# Patient Record
Sex: Female | Born: 1970 | Race: White | Hispanic: Yes | Marital: Married | Smoking: Never smoker
Health system: Southern US, Community
[De-identification: ages and names within clinical notes are randomized; demographics above are authoritative.]

## PROBLEM LIST (undated history)

## (undated) HISTORY — PX: APPENDECTOMY: SHX54

---

## 2016-04-22 DIAGNOSIS — N926 Irregular menstruation, unspecified: Secondary | ICD-10-CM | POA: Diagnosis not present

## 2016-04-22 DIAGNOSIS — Z01419 Encounter for gynecological examination (general) (routine) without abnormal findings: Secondary | ICD-10-CM | POA: Diagnosis not present

## 2016-05-21 DIAGNOSIS — Z1231 Encounter for screening mammogram for malignant neoplasm of breast: Secondary | ICD-10-CM | POA: Diagnosis not present

## 2016-05-23 DIAGNOSIS — L601 Onycholysis: Secondary | ICD-10-CM | POA: Diagnosis not present

## 2016-06-06 DIAGNOSIS — Z23 Encounter for immunization: Secondary | ICD-10-CM | POA: Diagnosis not present

## 2016-06-06 DIAGNOSIS — E785 Hyperlipidemia, unspecified: Secondary | ICD-10-CM | POA: Diagnosis not present

## 2016-06-06 DIAGNOSIS — N926 Irregular menstruation, unspecified: Secondary | ICD-10-CM | POA: Diagnosis not present

## 2016-06-06 DIAGNOSIS — E663 Overweight: Secondary | ICD-10-CM | POA: Diagnosis not present

## 2016-06-27 DIAGNOSIS — D485 Neoplasm of uncertain behavior of skin: Secondary | ICD-10-CM | POA: Diagnosis not present

## 2016-06-27 DIAGNOSIS — L601 Onycholysis: Secondary | ICD-10-CM | POA: Diagnosis not present

## 2017-04-28 DIAGNOSIS — K080 Exfoliation of teeth due to systemic causes: Secondary | ICD-10-CM | POA: Diagnosis not present

## 2017-08-04 DIAGNOSIS — K080 Exfoliation of teeth due to systemic causes: Secondary | ICD-10-CM | POA: Diagnosis not present

## 2017-09-08 DIAGNOSIS — Z0000 Encounter for general adult medical examination without abnormal findings: Secondary | ICD-10-CM | POA: Diagnosis not present

## 2017-09-08 DIAGNOSIS — Z131 Encounter for screening for diabetes mellitus: Secondary | ICD-10-CM | POA: Diagnosis not present

## 2017-09-08 DIAGNOSIS — Z13220 Encounter for screening for lipoid disorders: Secondary | ICD-10-CM | POA: Diagnosis not present

## 2017-09-08 DIAGNOSIS — Z1331 Encounter for screening for depression: Secondary | ICD-10-CM | POA: Diagnosis not present

## 2017-09-08 DIAGNOSIS — Z6823 Body mass index (BMI) 23.0-23.9, adult: Secondary | ICD-10-CM | POA: Diagnosis not present

## 2017-10-06 DIAGNOSIS — Z01419 Encounter for gynecological examination (general) (routine) without abnormal findings: Secondary | ICD-10-CM | POA: Diagnosis not present

## 2017-10-06 DIAGNOSIS — Z1231 Encounter for screening mammogram for malignant neoplasm of breast: Secondary | ICD-10-CM | POA: Diagnosis not present

## 2017-11-10 DIAGNOSIS — K080 Exfoliation of teeth due to systemic causes: Secondary | ICD-10-CM | POA: Diagnosis not present

## 2017-12-12 DIAGNOSIS — K080 Exfoliation of teeth due to systemic causes: Secondary | ICD-10-CM | POA: Diagnosis not present

## 2018-05-13 DIAGNOSIS — K080 Exfoliation of teeth due to systemic causes: Secondary | ICD-10-CM | POA: Diagnosis not present

## 2018-05-13 DIAGNOSIS — Z23 Encounter for immunization: Secondary | ICD-10-CM | POA: Diagnosis not present

## 2018-09-17 ENCOUNTER — Other Ambulatory Visit: Payer: Self-pay

## 2018-09-17 ENCOUNTER — Ambulatory Visit: Payer: Federal, State, Local not specified - PPO | Admitting: Sports Medicine

## 2018-09-17 ENCOUNTER — Encounter: Payer: Self-pay | Admitting: Sports Medicine

## 2018-09-17 ENCOUNTER — Other Ambulatory Visit: Payer: Self-pay | Admitting: Sports Medicine

## 2018-09-17 ENCOUNTER — Ambulatory Visit (INDEPENDENT_AMBULATORY_CARE_PROVIDER_SITE_OTHER): Payer: Federal, State, Local not specified - PPO

## 2018-09-17 VITALS — Temp 97.0°F | Resp 16

## 2018-09-17 DIAGNOSIS — M722 Plantar fascial fibromatosis: Secondary | ICD-10-CM | POA: Diagnosis not present

## 2018-09-17 DIAGNOSIS — M79671 Pain in right foot: Secondary | ICD-10-CM | POA: Diagnosis not present

## 2018-09-17 MED ORDER — PREDNISONE 10 MG (21) PO TBPK: ORAL_TABLET | ORAL | 0 refills | Status: DC

## 2018-09-17 MED ORDER — MELOXICAM 15 MG PO TABS: 15.0000 mg | ORAL_TABLET | Freq: Every day | ORAL | 0 refills | Status: DC

## 2018-09-17 MED ORDER — TRIAMCINOLONE ACETONIDE 40 MG/ML IJ SUSP
20.0000 mg | Freq: Once | INTRAMUSCULAR | Status: AC
  Administered 2018-09-17: 20 mg

## 2018-09-17 NOTE — Patient Instructions (Signed)

## 2018-09-17 NOTE — Progress Notes (Addendum)
Subjective: Paula Bautista is a 48 y.o. female patient presents to office with complaint of moderate heel pain on the right.  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  Patient is assisted by husband who is also translating this visit.  Denies any other pedal complaints.   Review of Systems  All other systems reviewed and are negative.    There are no active problems to display for this patient.   No current outpatient medications on file prior to visit.   No current facility-administered medications on file prior to visit.     Not on File  Objective: Physical Exam General: The patient is alert and oriented x3 in no acute distress.  Dermatology: Skin is warm, dry and supple bilateral lower extremities. Nails 1-10 are normal. There is no erythema, edema, no eccymosis, no open lesions present. Integument is otherwise unremarkable.  Vascular: Dorsalis Pedis pulse and Posterior Tibial pulse are 2/4 bilateral. Capillary fill time is immediate to all digits.  Neurological: Grossly intact to light touch with an achilles reflex of +2/5 and a  negative Tinel's sign bilateral.  Musculoskeletal: + Tenderness to palpation at the medial calcaneal tubercale and through the insertion of the plantar fascia on the right foot. No pain with compression of calcaneus bilateral. No pain with tuning fork to calcaneus bilateral. No pain with calf compression bilateral. There is decreased Ankle joint range of motion bilateral. All other joints range of motion within normal limits bilateral. Strength 5/5 in all groups bilateral.   Gait: Unassisted, Antalgic avoid weight on right heel  Xray, Right foot:  Normal osseous mineralization. Joint  spaces preserved. No fracture/dislocation/boney destruction.  Minimal calcaneal spur present with mild thickening of plantar fascia. No other soft tissue abnormalities or radiopaque foreign bodies.   Assessment and Plan: Problem List Items Addressed This Visit    None    Visit Diagnoses    Plantar fasciitis, right    -  Primary   Relevant Medications   triamcinolone acetonide (KENALOG-40) injection 20 mg (Completed) (Start on 09/17/2018 12:30 PM)   Pain of right heel          -Complete examination performed.  -Xrays reviewed -Discussed with patient in detail the condition of plantar fasciitis, how this occurs and general treatment options. Explained both conservative and surgical treatments.  -After oral consent and aseptic prep, injected a mixture containing 1 ml of 2%  plain lidocaine, 1 ml 0.5% plain marcaine, 0.5 ml of kenalog 40 and 0.5 ml of dexamethasone phosphate into right heel. Post-injection care discussed with patient.  -Rx Meloxicam to start after prednisone dose pack is completed -Recommended good supportive shoes and advised use of OTC insert. -Continue with night splint which she already owns -Explained and dispensed to patient daily stretching exercises. -Recommend patient to ice affected area 1-2x daily. -Patient to return to office in 4 weeks for follow up or sooner if problems or questions arise.  Landis Martins, DPM

## 2018-09-29 ENCOUNTER — Other Ambulatory Visit: Payer: Self-pay | Admitting: Sports Medicine

## 2018-09-29 DIAGNOSIS — M722 Plantar fascial fibromatosis: Secondary | ICD-10-CM

## 2018-10-16 ENCOUNTER — Ambulatory Visit: Payer: Federal, State, Local not specified - PPO | Admitting: Sports Medicine

## 2018-10-16 ENCOUNTER — Encounter: Payer: Self-pay | Admitting: Sports Medicine

## 2018-10-16 ENCOUNTER — Other Ambulatory Visit: Payer: Self-pay

## 2018-10-16 VITALS — Temp 97.2°F | Resp 16

## 2018-10-16 DIAGNOSIS — M722 Plantar fascial fibromatosis: Secondary | ICD-10-CM

## 2018-10-16 DIAGNOSIS — M79671 Pain in right foot: Secondary | ICD-10-CM

## 2018-10-16 MED ORDER — TRIAMCINOLONE ACETONIDE 40 MG/ML IJ SUSP
20.0000 mg | Freq: Once | INTRAMUSCULAR | Status: AC
  Administered 2018-10-16: 20 mg

## 2018-10-16 NOTE — Progress Notes (Signed)
Subjective: Myleka Moncure Jerilee Hoh is a 48 y.o. female returns to office for follow up evaluation after Right heel injection for plantar fasciitis, injection #1 administered 3 weeks ago. Patient states that the injection seems to help her pain; pain is now 3/10 and has  decreased in frequency to the area. Patient denies any recent changes in medications or new problems since last visit.   There are no active problems to display for this patient.   Current Outpatient Medications on File Prior to Visit  Medication Sig Dispense Refill  . meloxicam (MOBIC) 15 MG tablet Take 1 tablet (15 mg total) by mouth daily. 30 tablet 0  . predniSONE (STERAPRED UNI-PAK 21 TAB) 10 MG (21) TBPK tablet Take as directed 21 tablet 0   No current facility-administered medications on file prior to visit.     Not on File  Objective:   General:  Alert and oriented x 3, in no acute distress  Dermatology: Skin is warm, dry, and supple bilateral. Nails are within normal limits. There is no lower extremity erythema, no eccymosis, no open lesions present bilateral.   Vascular: Dorsalis Pedis and Posterior Tibial pedal pulses are 2/4 bilateral. + hair growth noted bilateral. Capillary Fill Time is 3 seconds in all digits. No varicosities, No edema bilateral lower extremities.   Neurological: Gross sensation intact via light touch.  Musculoskeletal: There is decreased tenderness to palpation at the medial calcaneal tubercale and through the insertion of the plantar fascia on the right foot. No pain with compression to calcaneus or application of tuning fork. There is decreased Ankle joint range of motion bilateral. All other jointsrange of motion  within normal limits bilateral. Strength 5/5 bilateral.   Assessment and Plan: Problem List Items Addressed This Visit    None    Visit Diagnoses    Plantar fasciitis, right    -  Primary   Relevant Medications   triamcinolone acetonide (KENALOG-40) injection  20 mg (Start on 10/16/2018  9:00 AM)   Pain of right heel          -Complete examination performed.  -Previous x-rays reviewed. -Re-Discussed with patient in detail the condition of plantar fasciitis, how this occurs related to the foot type of the patient and general treatment options. - Patient opted for another injection today; After oral consent and aseptic prep, injected a mixture containing 1 ml of 1%plain lidocaine, 1 ml 0.5% plain marcaine, 0.5 ml of kenalog 40 and 0.5 ml of dexmethasone phosphate to right heel at plantar fascial at area of most pain/trigger point injection. -Dispensed plantar fascial brace for the right -Continue with night splint -Continue with stretching, icing, good supportive shoes, inserts daily.  -Discussed long term care and reocurrence; will closely monitor; if fails to improve will consider other treatment modalities.  -Patient to return to office in 4 to 6 weeks for follow up or sooner if problems or questions arise.  Landis Martins, DPM

## 2018-10-22 ENCOUNTER — Other Ambulatory Visit: Payer: Self-pay | Admitting: Sports Medicine

## 2018-10-22 ENCOUNTER — Other Ambulatory Visit: Payer: Self-pay

## 2018-10-22 ENCOUNTER — Ambulatory Visit (INDEPENDENT_AMBULATORY_CARE_PROVIDER_SITE_OTHER): Payer: Federal, State, Local not specified - PPO

## 2018-10-22 ENCOUNTER — Encounter: Payer: Self-pay | Admitting: Sports Medicine

## 2018-10-22 ENCOUNTER — Ambulatory Visit (INDEPENDENT_AMBULATORY_CARE_PROVIDER_SITE_OTHER): Payer: Federal, State, Local not specified - PPO | Admitting: Sports Medicine

## 2018-10-22 VITALS — Temp 97.3°F | Resp 16

## 2018-10-22 DIAGNOSIS — M722 Plantar fascial fibromatosis: Secondary | ICD-10-CM | POA: Diagnosis not present

## 2018-10-22 DIAGNOSIS — M79671 Pain in right foot: Secondary | ICD-10-CM

## 2018-10-22 MED ORDER — DICLOFENAC SODIUM 75 MG PO TBEC: 75.0000 mg | DELAYED_RELEASE_TABLET | Freq: Two times a day (BID) | ORAL | 0 refills | Status: DC

## 2018-10-22 NOTE — Progress Notes (Addendum)
Subjective: Paula Bautista is a 48 y.o. female returns to office for follow up right heel pain patient reports by translation from husband that after last visit was doing good but then was walking into the garage and felt a pop on the bottom of her right heel with increased pain that has been sharp patient reports that the brace that she is currently using is making the pain worse reports that she feels some pulling that goes into her calf and behind her knee when she is stretching.  Patient denies any significant bruising redness warmth.  No other pedal complaints noted.  There are no active problems to display for this patient.   No current outpatient medications on file prior to visit.   No current facility-administered medications on file prior to visit.     Not on File  Objective:   General:  Alert and oriented x 3, in no acute distress  Dermatology: Skin is warm, dry, and supple bilateral. Nails are within normal limits. There is no lower extremity erythema, no eccymosis, no open lesions present bilateral.  No ecchymosis on right.  Vascular: Dorsalis Pedis and Posterior Tibial pedal pulses are 2/4 bilateral. + hair growth noted bilateral. Capillary Fill Time is 3 seconds in all digits. No varicosities, No edema bilateral lower extremities.   Neurological: Gross sensation intact via light touch.  Musculoskeletal: There is  tenderness to palpation at the medial calcaneal tubercale and through the insertion of the plantar fascia on the right foot.  No pain to palpation of calf on the right.  Subjective pulling sensation with flexion and extension on right calf.  No pain with compression to calcaneus or application of tuning fork. There is decreased Ankle joint range of motion bilateral. All other joints range of motion  within normal limits bilateral except on right with there is mild guarding. Strength 5/5 bilateral.   X-rays right foot consistent with previous no acute  findings  Assessment and Plan: Problem List Items Addressed This Visit    None    Visit Diagnoses    Plantar fasciitis, right    -  Primary   Possible partial tear   Relevant Medications   diclofenac (VOLTAREN) 75 MG EC tablet   Pain of right heel       Relevant Medications   diclofenac (VOLTAREN) 75 MG EC tablet     -Complete examination performed.  -New xrays with no new findings reviewed at this visit -Discussed with patient the possibility of a partial plantar fascial tear versus strain injury -Advised patient to rest ice elevate twice daily -Dispensed cam boot and advised patient to use her boot anytime she is attempting to ambulate and right now to hold off on using tennis shoe and plantar fascial brace until symptoms are improved -Prescribed diclofenac for patient to take twice daily -Advised patient that she should continue with gentle stretching with use of her night splint however if it is making her pain worse to discontinue this as well -Advised patient to avoid any type of strenuous activity that could cause worsening of symptoms -Patient to return to office in 2-week for reevaluation  Landis Martins, DPM

## 2018-11-02 ENCOUNTER — Other Ambulatory Visit: Payer: Self-pay | Admitting: Sports Medicine

## 2018-11-02 DIAGNOSIS — M79671 Pain in right foot: Secondary | ICD-10-CM

## 2018-11-02 DIAGNOSIS — M722 Plantar fascial fibromatosis: Secondary | ICD-10-CM

## 2018-11-27 ENCOUNTER — Other Ambulatory Visit: Payer: Self-pay

## 2018-11-27 ENCOUNTER — Ambulatory Visit (INDEPENDENT_AMBULATORY_CARE_PROVIDER_SITE_OTHER): Payer: Federal, State, Local not specified - PPO | Admitting: Sports Medicine

## 2018-11-27 DIAGNOSIS — M722 Plantar fascial fibromatosis: Secondary | ICD-10-CM

## 2018-11-27 DIAGNOSIS — M79671 Pain in right foot: Secondary | ICD-10-CM | POA: Diagnosis not present

## 2018-11-27 MED ORDER — DICLOFENAC SODIUM 1 % TD GEL: 4.0000 g | Freq: Four times a day (QID) | TRANSDERMAL | 0 refills | Status: DC

## 2018-11-27 NOTE — Progress Notes (Signed)
Subjective: Paula Bautista is a 48 y.o. female returns to office for follow up right heel pain; patient reports by translation from husband that she has no heel or calf pain but a little pain at ball of the foot since wearing the boot;was loose. Pain at ball worse with pressure.  No other pedal complaints noted.  There are no active problems to display for this patient.   Current Outpatient Medications on File Prior to Visit  Medication Sig Dispense Refill  . diclofenac (VOLTAREN) 75 MG EC tablet Take 1 tablet (75 mg total) by mouth 2 (two) times daily. 30 tablet 0   No current facility-administered medications on file prior to visit.     Not on File  Objective:   General:  Alert and oriented x 3, in no acute distress  Dermatology: Skin is warm, dry, and supple bilateral. Nails are within normal limits. There is no lower extremity erythema, no eccymosis, no open lesions present bilateral.  No ecchymosis on right.  Vascular: Dorsalis Pedis and Posterior Tibial pedal pulses are 2/4 bilateral. + hair growth noted bilateral. Capillary Fill Time is 3 seconds in all digits. No varicosities, No edema bilateral lower extremities.   Neurological: Gross sensation intact via light touch.  Musculoskeletal: There is no  tenderness to palpation at the medial calcaneal tubercale and through the insertion of the plantar fascia on the right foot. Diffuse tenderness to ball.  No pain to palpation of calf on the right.No pain with compression to calcaneus or application of tuning fork. There is decreased Ankle joint range of motion bilateral. All other joints range of motion  within normal limits. Strength 5/5 bilateral.   Assessment and Plan: Problem List Items Addressed This Visit    None    Visit Diagnoses    Plantar fasciitis, right    -  Primary   Pain of right heel         -Complete examination performed.  -Re-discussed with patient and husband the possibility of a partial  plantar fascial tear versus strain injury that is better with metatarsalgia -Rx voltaren gel -Dispensed Met pad -Gave replacement cam boot -Advised patient to rest ice elevate twice daily -Continue with rest, ice, elevation daily  -Patient to return to office if pain not better after 4 weeks.   Landis Martins, DPM

## 2019-03-09 DIAGNOSIS — M9905 Segmental and somatic dysfunction of pelvic region: Secondary | ICD-10-CM | POA: Diagnosis not present

## 2019-03-09 DIAGNOSIS — S39012A Strain of muscle, fascia and tendon of lower back, initial encounter: Secondary | ICD-10-CM | POA: Diagnosis not present

## 2019-03-09 DIAGNOSIS — M9903 Segmental and somatic dysfunction of lumbar region: Secondary | ICD-10-CM | POA: Diagnosis not present

## 2019-03-09 DIAGNOSIS — R293 Abnormal posture: Secondary | ICD-10-CM | POA: Diagnosis not present

## 2019-03-18 DIAGNOSIS — M9905 Segmental and somatic dysfunction of pelvic region: Secondary | ICD-10-CM | POA: Diagnosis not present

## 2019-03-18 DIAGNOSIS — S39012A Strain of muscle, fascia and tendon of lower back, initial encounter: Secondary | ICD-10-CM | POA: Diagnosis not present

## 2019-03-18 DIAGNOSIS — R293 Abnormal posture: Secondary | ICD-10-CM | POA: Diagnosis not present

## 2019-03-18 DIAGNOSIS — M9903 Segmental and somatic dysfunction of lumbar region: Secondary | ICD-10-CM | POA: Diagnosis not present

## 2019-05-25 ENCOUNTER — Ambulatory Visit: Payer: Federal, State, Local not specified - PPO | Admitting: Family Medicine

## 2019-05-27 ENCOUNTER — Other Ambulatory Visit: Payer: Self-pay

## 2019-05-27 ENCOUNTER — Ambulatory Visit: Payer: Federal, State, Local not specified - PPO | Admitting: Physician Assistant

## 2019-05-27 ENCOUNTER — Encounter: Payer: Self-pay | Admitting: Physician Assistant

## 2019-05-27 VITALS — BP 102/60 | HR 59 | Temp 97.3°F | Resp 16 | Ht 66.0 in | Wt 157.0 lb

## 2019-05-27 DIAGNOSIS — M2550 Pain in unspecified joint: Secondary | ICD-10-CM | POA: Insufficient documentation

## 2019-05-27 DIAGNOSIS — Z1231 Encounter for screening mammogram for malignant neoplasm of breast: Secondary | ICD-10-CM

## 2019-05-27 DIAGNOSIS — M2559 Pain in other specified joint: Secondary | ICD-10-CM

## 2019-05-27 MED ORDER — MELOXICAM 7.5 MG PO TABS: 7.5000 mg | ORAL_TABLET | Freq: Every day | ORAL | 2 refills | Status: DC

## 2019-05-27 NOTE — Progress Notes (Signed)
New Patient Office Visit  Subjective:  Patient ID: Paula Bautista, female    DOB: 07/28/1970  Age: 49 y.o. MRN: [id number]  CC:  Chief Complaint  Patient presents with  . Joint Pain    HPI Paula Bautista presents for joint pain- husband translates Pt states that for the past 3-4 weeks she has had joint pain 'all over'- she describes pain in both shoulders, both elbows and wrists, both knees and occasionally her hips She cannot recall any history of injury or trauma - denies any type of tick exposure Denies red, hot , or swollen joints Has not tried any otc meds to help with relief  Pt would like to get scheduled for a mammogram and also will schedule for complete physcial  History reviewed. No pertinent past medical history.  Past Surgical History:  Procedure Laterality Date  . APPENDECTOMY      Family History  Problem Relation Age of Onset  . Heart attack Mother   . Heart attack Father     Social History   Socioeconomic History  . Marital status: Married    Spouse name: Not on file  . Number of children: Not on file  . Years of education: Not on file  . Highest education level: Not on file  Occupational History  . Occupation: housewife  Tobacco Use  . Smoking status: Never Smoker  . Smokeless tobacco: Never Used  Substance and Sexual Activity  . Alcohol use: Yes    Comment: socisal  . Drug use: Never  . Sexual activity: Not on file  Other Topics Concern  . Not on file  Social History Narrative  . Not on file   Social Determinants of Health   Financial Resource Strain:   . Difficulty of Paying Living Expenses:   Food Insecurity:   . Worried About Charity fundraiser in the Last Year:   . Arboriculturist in the Last Year:   Transportation Needs:   . Film/video editor (Medical):   Marland Kitchen Lack of Transportation (Non-Medical):   Physical Activity:   . Days of Exercise per Week:   . Minutes of Exercise per Session:     Stress:   . Feeling of Stress :   Social Connections:   . Frequency of Communication with Friends and Family:   . Frequency of Social Gatherings with Friends and Family:   . Attends Religious Services:   . Active Member of Clubs or Organizations:   . Attends Archivist Meetings:   Marland Kitchen Marital Status:   Intimate Partner Violence:   . Fear of Current or Ex-Partner:   . Emotionally Abused:   Marland Kitchen Physically Abused:   . Sexually Abused:      Current Outpatient Medications:  Marland Kitchen  Multiple Vitamin (MULTIVITAMIN) capsule, Take 1 capsule by mouth daily., Disp: , Rfl:  .  Omega-3 Fatty Acids (FISH OIL) 1000 MG CAPS, Take by mouth., Disp: , Rfl:  .  meloxicam (MOBIC) 7.5 MG tablet, Take 1 tablet (7.5 mg total) by mouth daily., Disp: 30 tablet, Rfl: 2   No Known Allergies  ROS CONSTITUTIONAL: Negative for chills, fatigue, fever, unintentional weight gain and unintentional weight loss.  E/N/T: Negative for ear pain, nasal congestion and sore throat.  CARDIOVASCULAR: Negative for chest pain, dizziness, palpitations and pedal edema.  RESPIRATORY: Negative for recent cough and dyspnea.  GASTROINTESTINAL: Negative for abdominal pain, acid reflux symptoms, constipation, diarrhea, nausea and vomiting.  MSK: see HPI  INTEGUMENTARY: Negative for rash.  NEUROLOGICAL: Negative for dizziness and headaches.  PSYCHIATRIC: Negative for sleep disturbance and to question depression screen.  Negative for depression, negative for anhedonia.        Objective:    PHYSICAL EXAM:   VS: BP 102/60   Pulse (!) 59   Temp (!) 97.3 F (36.3 C)   Resp 16   Ht 5' 6" (1.676 m)   Wt 157 lb (71.2 kg)   SpO2 98%   BMI 25.34 kg/m   GEN: Well nourished, well developed, in no acute distress  HEENt  normal external ears and nose - normal external auditory canals and TMS -  Oropharynx - normal mucosa, palate, and posterior pharynx Cardiac: RRR; no murmurs, rubs, or gallops,no edema - no significant  varicosities Respiratory:  normal respiratory rate and pattern with no distress - normal breath sounds with no rales, rhonchi, wheezes or rubs GI: normal bowel sounds, no masses or tenderness MS: no deformity or atrophy  Skin: warm and dry, no rash  Neuro:  Alert and Oriented x 3, Strength and sensation are intact - CN II-Xii grossly intact Psych: euthymic mood, appropriate affect and demeanor  BP 102/60   Pulse (!) 59   Temp (!) 97.3 F (36.3 C)   Resp 16   Ht 5' 6" (1.676 m)   Wt 157 lb (71.2 kg)   SpO2 98%   BMI 25.34 kg/m  Wt Readings from Last 3 Encounters:  05/27/19 157 lb (71.2 kg)     Health Maintenance Due  Topic Date Due  . HIV Screening  Never done  . PAP SMEAR-Modifier  Never done    There are no preventive care reminders to display for this patient.  No results found for: TSH No results found for: WBC, HGB, HCT, MCV, PLT No results found for: NA, K, CHLORIDE, CO2, GLUCOSE, BUN, CREATININE, BILITOT, ALKPHOS, AST, ALT, PROT, ALBUMIN, CALCIUM, ANIONGAP, EGFR, GFR No results found for: CHOL No results found for: HDL No results found for: LDLCALC No results found for: TRIG No results found for: CHOLHDL No results found for: HGBA1C    Assessment & Plan:   Problem List Items Addressed This Visit      Other   Joint pain - Primary    Will try meloxicam 7.61m qd labwork pending Pt to schedule for physical      Relevant Orders   RA Expanded Profile   Comprehensive metabolic panel   CBC with Differential/Platelet   Visit for screening mammogram    Mammogram ordered      Relevant Orders   MM Digital Screening      Meds ordered this encounter  Medications  . meloxicam (MOBIC) 7.5 MG tablet    Sig: Take 1 tablet (7.5 mg total) by mouth daily.    Dispense:  30 tablet    Refill:  2    Order Specific Question:   Supervising Provider    Answer:Shelton Silvas   Follow-up: Return for schedule with Dr CTobie Poetfor pap.    Paula R Menna Abeln,  PA-C

## 2019-05-27 NOTE — Assessment & Plan Note (Signed)
Will try meloxicam 7.5mg  qd labwork pending Pt to schedule for physical

## 2019-05-27 NOTE — Assessment & Plan Note (Signed)
Mammogram ordered

## 2019-05-29 LAB — RA EXPANDED PROFILE
CRP: 2 mg/L (ref 0–10)
Cyclic Citrullin Peptide Ab: 4 units (ref 0–19)
Rheumatoid fact SerPl-aCnc: <10 IU/mL (ref 0.0–13.9)
Sed Rate: 6 mm/hr (ref 0–32)

## 2019-05-29 LAB — COMPREHENSIVE METABOLIC PANEL
ALT: 28 IU/L (ref 0–32)
AST: 28 IU/L (ref 0–40)
Albumin/Globulin Ratio: 1.7 (ref 1.2–2.2)
Albumin: 4.5 g/dL (ref 3.8–4.8)
Alkaline Phosphatase: 86 IU/L (ref 39–117)
BUN/Creatinine Ratio: 19 (ref 9–23)
BUN: 14 mg/dL (ref 6–24)
Bilirubin Total: 0.5 mg/dL (ref 0.0–1.2)
CO2: 24 mmol/L (ref 20–29)
Calcium: 9.5 mg/dL (ref 8.7–10.2)
Chloride: 103 mmol/L (ref 96–106)
Creatinine, Ser: 0.75 mg/dL (ref 0.57–1.00)
GFR calc Af Amer: 109 mL/min/{1.73_m2} (ref >59)
GFR calc non Af Amer: 95 mL/min/{1.73_m2} (ref >59)
Globulin, Total: 2.7 g/dL (ref 1.5–4.5)
Glucose: 78 mg/dL (ref 65–99)
Potassium: 4.4 mmol/L (ref 3.5–5.2)
Sodium: 140 mmol/L (ref 134–144)
Total Protein: 7.2 g/dL (ref 6.0–8.5)

## 2019-05-29 LAB — CBC WITH DIFFERENTIAL/PLATELET
Basophils Absolute: 0.1 10*3/uL (ref 0.0–0.2)
Basos: 1 %
EOS (ABSOLUTE): 0.1 10*3/uL (ref 0.0–0.4)
Eos: 2 %
Hematocrit: 43.9 % (ref 34.0–46.6)
Hemoglobin: 15 g/dL (ref 11.1–15.9)
Immature Grans (Abs): 0 10*3/uL (ref 0.0–0.1)
Immature Granulocytes: 0 %
Lymphocytes Absolute: 1.9 10*3/uL (ref 0.7–3.1)
Lymphs: 40 %
MCH: 30.9 pg (ref 26.6–33.0)
MCHC: 34.2 g/dL (ref 31.5–35.7)
MCV: 90 fL (ref 79–97)
Monocytes Absolute: 0.3 10*3/uL (ref 0.1–0.9)
Monocytes: 7 %
Neutrophils Absolute: 2.3 10*3/uL (ref 1.4–7.0)
Neutrophils: 50 %
Platelets: 259 10*3/uL (ref 150–450)
RBC: 4.86 x10E6/uL (ref 3.77–5.28)
RDW: 11.7 % (ref 11.7–15.4)
WBC: 4.7 10*3/uL (ref 3.4–10.8)

## 2019-06-23 ENCOUNTER — Encounter: Payer: Self-pay | Admitting: Family Medicine

## 2019-06-23 NOTE — Progress Notes (Signed)
Cancelled by patient. kc 

## 2019-06-25 ENCOUNTER — Ambulatory Visit (INDEPENDENT_AMBULATORY_CARE_PROVIDER_SITE_OTHER): Payer: Federal, State, Local not specified - PPO | Admitting: Family Medicine

## 2019-06-25 DIAGNOSIS — Z0000 Encounter for general adult medical examination without abnormal findings: Secondary | ICD-10-CM

## 2019-07-01 NOTE — Progress Notes (Addendum)
Subjective:  Patient ID: Paula Bautista, female    DOB: [birth year]  Age: 49 y.o. MRN: [id number]  Chief Complaint  Patient presents with  . Annual Exam    PAP only. CPE was done 06/25/2019    Gynecologic Exam The patient's pertinent negatives include no vaginal discharge. The patient is experiencing no pain. She is not pregnant. Pertinent negatives include no abdominal pain, back pain, chills, constipation, diarrhea, dysuria, fever, headaches, nausea, sore throat, urgency or vomiting. She is sexually active. Contraceptive use: menopause. She is postmenopausal. There is no history of miscarriage.   Kian wears her seatbelt, she eats healthy, she exercises several times a week, she is functional smoke detectors and carbon monoxide detectors in her home, she practices appropriate gun safety, she sees a Education officer, community twice a year, she brushes and flosses, and she sees the eye doctor every 1 to 2 years.  She does wear glasses for reading.  Her last eye exam was February/2020. Her last menstrual period was September 2020.  She is experiencing hot flashes.  She does not feel she needs HRT.  Her last Pap smear was 2018 and was negative for HPV.  She is married.  She has never had a colonoscopy. She has no significant complaints today.   Immunizations reviewed. She reports that she has never had high cholesterol. Denies smoking and drug use.  She does drink 2 cans of beer per week.  History reviewed. No pertinent past medical history. Past Surgical History:  Procedure Laterality Date  . APPENDECTOMY      Family History  Problem Relation Age of Onset  . Heart attack Mother   . Heart attack Father    Social History   Socioeconomic History  . Marital status: Married    Spouse name: Not on file  . Number of children: Not on file  . Years of education: Not on file  . Highest education level: Not on file  Occupational History  . Occupation: housewife  Tobacco Use  . Smoking  status: Never Smoker  . Smokeless tobacco: Never Used  Substance and Sexual Activity  . Alcohol use: Yes    Alcohol/week: 2.0 standard drinks    Types: 2 Cans of beer per week    Comment: social  . Drug use: Never  . Sexual activity: Yes    Partners: Male  Other Topics Concern  . Not on file  Social History Narrative  . Not on file   Social Determinants of Health   Financial Resource Strain:   . Difficulty of Paying Living Expenses:   Food Insecurity:   . Worried About Programme researcher, broadcasting/film/video in the Last Year:   . Barista in the Last Year:   Transportation Needs:   . Freight forwarder (Medical):   Marland Kitchen Lack of Transportation (Non-Medical):   Physical Activity: Sufficiently Active  . Days of Exercise per Week: 6 days  . Minutes of Exercise per Session: 40 min  Stress:   . Feeling of Stress :   Social Connections:   . Frequency of Communication with Friends and Family:   . Frequency of Social Gatherings with Friends and Family:   . Attends Religious Services:   . Active Member of Clubs or Organizations:   . Attends Banker Meetings:   Marland Kitchen Marital Status:     Review of Systems  Constitutional: Negative for chills, fatigue and fever.  HENT: Negative for congestion, ear pain, rhinorrhea and sore throat.  Respiratory: Negative for cough and shortness of breath.   Cardiovascular: Negative for chest pain.  Gastrointestinal: Negative for abdominal pain, constipation, diarrhea, nausea and vomiting.  Genitourinary: Negative for dysuria, urgency and vaginal discharge.  Musculoskeletal: Negative for back pain and myalgias.  Neurological: Negative for dizziness, weakness, light-headedness and headaches.  Psychiatric/Behavioral: Negative for dysphoric mood. The patient is not nervous/anxious.      Objective:  BP 110/78   Pulse 72   Temp 97.6 F (36.4 C)   Resp 16   Ht 5\' 6"  (1.676 m)   Wt 157 lb (71.2 kg)   BMI 25.34 kg/m   BP/Weight 07/05/2019  10/11/9831  Systolic BP 825 053  Diastolic BP 78 60  Wt. (Lbs) 157 157  BMI 25.34 25.34    Physical Exam Exam conducted with a chaperone present.  Constitutional:      Appearance: Normal appearance.  HENT:     Right Ear: Tympanic membrane, ear canal and external ear normal.     Left Ear: Tympanic membrane, ear canal and external ear normal.     Nose: Nose normal.     Mouth/Throat:     Mouth: Mucous membranes are moist.     Pharynx: Oropharynx is clear.  Neck:     Vascular: No carotid bruit.  Cardiovascular:     Rate and Rhythm: Normal rate and regular rhythm.     Pulses: Normal pulses.     Heart sounds: Normal heart sounds.  Pulmonary:     Effort: Pulmonary effort is normal.     Breath sounds: Normal breath sounds.  Chest:     Breasts: Tanner Score is 5.        Right: Normal.        Left: Normal.  Abdominal:     General: Bowel sounds are normal.  Genitourinary:    General: Normal vulva.     Vagina: No vaginal discharge.     Comments: Pap smear taken Musculoskeletal:        General: Normal range of motion.  Lymphadenopathy:     Upper Body:     Right upper body: No axillary adenopathy.     Left upper body: No axillary adenopathy.  Skin:    General: Skin is warm and dry.     Findings: No lesion.  Neurological:     Mental Status: She is alert and oriented to person, place, and time.  Psychiatric:        Mood and Affect: Mood normal.        Behavior: Behavior normal.     Lab Results  Component Value Date   WBC 4.7 05/27/2019   HGB 15.0 05/27/2019   HCT 43.9 05/27/2019   PLT 259 05/27/2019   GLUCOSE 78 05/27/2019   ALT 28 05/27/2019   AST 28 05/27/2019   NA 140 05/27/2019   K 4.4 05/27/2019   CL 103 05/27/2019   CREATININE 0.75 05/27/2019   BUN 14 05/27/2019   CO2 24 05/27/2019      Assessment & Plan:  1. Routine medical exam Healthy female.  Continue eating healthy and exercise.  2. Screening for cervical cancer - IGP, Aptima HPV, rfx  16/18,45  3. Encounter for screening mammogram for malignant neoplasm of breast - MM Digital Screening; Future  Orders Placed This Encounter  Procedures  . MM Digital Screening     Follow-up: Return in about 1 year (around 07/04/2020) for fasting.  An After Visit Summary was printed and given to the patient.  Blane Ohara [facility] [phone]  Change [phone] to [phone]. Dr Sedalia Muta

## 2019-07-05 ENCOUNTER — Ambulatory Visit: Payer: Federal, State, Local not specified - PPO | Admitting: Family Medicine

## 2019-07-05 ENCOUNTER — Encounter: Payer: Self-pay | Admitting: Family Medicine

## 2019-07-05 ENCOUNTER — Other Ambulatory Visit: Payer: Self-pay

## 2019-07-05 VITALS — BP 110/78 | HR 72 | Temp 97.6°F | Resp 16 | Ht 66.0 in | Wt 157.0 lb

## 2019-07-05 DIAGNOSIS — Z1231 Encounter for screening mammogram for malignant neoplasm of breast: Secondary | ICD-10-CM

## 2019-07-05 DIAGNOSIS — Z0000 Encounter for general adult medical examination without abnormal findings: Secondary | ICD-10-CM

## 2019-07-05 DIAGNOSIS — Z124 Encounter for screening for malignant neoplasm of cervix: Secondary | ICD-10-CM | POA: Diagnosis not present

## 2019-07-05 NOTE — Patient Instructions (Addendum)
Cuidados preventivos en las mujeres de 27 a 76 aos de edad Preventive Care 31-48 Years Old, Female Los cuidados preventivos hacen referencia a las opciones en cuanto a las visitas al mdico y al estilo de vida, las cuales pueden promover la salud y Musician. Esto puede comprender lo siguiente:  Un examen fsico anual. Esto tambin se puede llamar control de bienestar anual.  Visitas regulares al dentista y exmenes oculares.  Vacunas.  Estudios para Engineer, building services.  Opciones saludables de estilo de vida, como seguir una dieta saludable, hacer ejercicio regularmente, no usar drogas ni productos que contengan nicotina y tabaco, y limitar el consumo de alcohol. Qu puedo esperar para mi visita de cuidado preventivo? Examen fsico El mdico revisar lo siguiente:  IT consultant y [place]. Esto se puede usar para calcular el ndice de masa corporal ([place]), que indica si tiene un peso saludable.  Frecuencia cardaca y presin arterial.  Piel para detectar manchas anormales. Asesoramiento Su mdico puede preguntarle acerca de:  Consumo de tabaco, alcohol y drogas.  Su bienestar emocional.  El bienestar en el hogar y sus relaciones personales.  Su actividad sexual.  Sus hbitos de alimentacin.  Su trabajo y [place] laboral.  Mtodos anticonceptivos.  Su ciclo menstrual.  Sus antecedentes de Media planner. Qu vacunas necesito?  [place] antigripal  Se recomienda aplicarse esta vacuna todos los Rudy. Vacuna contra el ttanos, difteria y tos ferina (Tdap)  Es posible que tenga que aplicarse un refuerzo contra el ttanos y la difteria (DT) cada 10aos. Vacuna contra la varicela  Es posible que tenga que aplicrsela si no recibi esta vacuna. Vacuna contra el herpes zster (culebrilla)  Es posible que la necesite despus de los 52 aos de [place]. Vacuna contra el sarampin, rubola y paperas (SRP)  Es posible que necesite aplicarse al menos una dosis de la vacuna  SRP si naci despus de [phone]. Podra tambin necesitar una segunda dosis. Vacuna antineumoccica conjugada (PCV13)  Puede necesitar esta vacuna si tiene determinadas enfermedades y no se vacun anteriormente. Edward Jolly antineumoccica de polisacridos (PPSV23)  Quizs tenga que aplicarse una o dos dosis si fuma o si tiene determinadas afecciones. Edward Jolly antimeningoccica conjugada (MenACWY)  Puede necesitar esta vacuna si tiene determinadas afecciones. Vacuna contra la hepatitis A  Es posible que necesite esta vacuna si tiene ciertas afecciones o si viaja o trabaja en lugares en los que podra estar expuesta a la hepatitis A. Vacuna contra la hepatitis B  Es posible que necesite esta vacuna si tiene ciertas afecciones o si viaja o trabaja en lugares en los que podra estar expuesta a la hepatitis B. Vacuna antihaemophilus influenzae tipo B (Hib)  Puede necesitar esta vacuna si tiene determinadas afecciones. Vacuna contra el virus del papiloma humano (VPH)  Si el mdico se lo recomienda, Research scientist (physical sciences) tres dosis a lo largo de 6 meses. Puede recibir las vacunas en forma de dosis individuales o en forma de dos o ms vacunas juntas en la misma inyeccin (vacunas combinadas). Hable con su mdico [employer] y beneficios de las vacunas combinadas. Qu pruebas necesito? Anlisis de [employer] de lpidos y colesterol. Estos se pueden verificar cada 5 aos o, con ms frecuencia, si usted tiene ms de 42 aos de edad.  Anlisis de hepatitisC.  Anlisis de hepatitisB. Pruebas de deteccin  Pruebas de deteccin de cncer de pulmn. Es posible que se le realice esta prueba de deteccin a partir de los 2 aos de edad, si ha fumado durante 30 aos  un paquete diario y sigue fumando o dej el hbito en algn momento en los ltimos 15 aos.  Pruebas de Programme researcher, broadcasting/film/video de Surveyor, minerals. Todos los adultos a partir de los 3 aos de edad y [place] 18 aos de edad deben hacerse esta  prueba de deteccin. El mdico puede recomendarle las pruebas de deteccin a partir de los 64 aos de edad si corre un mayor riesgo. Le realizarn pruebas cada 1 a 10 aos, segn los [place] y el tipo de prueba de Programme researcher, broadcasting/film/video.  Pruebas de deteccin de la diabetes. Esto se Set designer un control del azcar en la sangre (glucosa) despus de no haber comido durante un periodo de tiempo (ayuno). Es posible que se le realice esta prueba cada 1 a 3 aos.  Mamografa. Se puede realizar cada 1 o 2 aos. Hable con su mdico sobre cundo debe comenzar a Engineer, manufacturing de [place] regular. Esto depende de si tiene antecedentes familiares de cncer de mama o no.  Pruebas de deteccin de cncer relacionado con las mutaciones del BRCA. Es posible que se las deba realizar si tiene antecedentes de cncer de mama, de ovario, de trompas o peritoneal.  Examen plvico y prueba de Papanicolaou. Esto se puede realizar cada 8aos a Renato Gails de los 21aos de edad. A partir de los 30 aos, esto se puede Optometrist cada 5 aos si usted se realiza una prueba de Papanicolaou en combinacin con una prueba de deteccin del virus del papiloma humano (VPH). Otras pruebas  Anlisis de enfermedades de transmisin sexual (ETS).  Densitometra sea. Esto se realiza para detectar osteoporosis. Se le puede realizar este examen de deteccin si tiene un riesgo alto de tener osteoporosis. Siga estas instrucciones en su casa: Comida y bebida  Siga una dieta que incluya frutas frescas y verduras, cereales integrales, lcteos descremados y protenas magras.  Tome los suplementos vitamnicos y [employer] se lo haya indicado el mdico.  No beba alcohol si: ? Su mdico le indica no hacerlo. ? Est embarazada, puede estar embarazada o est tratando de quedar embarazada.  Si bebe alcohol: ? Limite la cantidad que consume de 0 a 1 medida por da. ? Est atenta a la cantidad de alcohol que hay en las bebidas que toma. En los  [place], una medida equivale a una botella de cerveza de 12oz (331m), un vaso de vino de 5oz (1458m o un vaso de una bebida alcohlica de alta graduacin de 1oz (4469m Estilo de vidNavistar International Corporationlas encas a diario.  Mantngase activa. Haga al menos 4m28mos de ejercicio 5o ms das Hilton Hotelso consuma ningn producto que contenga nicotina o tabaco, como cigarrillos, cigarrillos electrnicos y tabaco de mascHigher education careers adviser necesita ayuda para dejar de fumar, consulte al mdico.  Si es sexualmente activa, practique sexo seguro. Use un condn u otra forma de mtodo anticonceptivo (anticonceptivos) a fin de evitEnvironmental health practitionerTS (infecciones de transmisin sexual).  Si el mdico se lo indic, tome una dosis baja de aspirina diariamente a partir de los 50 a70 de edadOak Hillsndo volver?  Visite al mdico una vez al ao para una visita de control.  Pregntele al mdico con qu frecuencia debe realizarse un control de la vista y los dientes.  Mantenga su esquema de vacunacin al da. Esta informacin no tiene comoMarine scientistconsejo del mdico. Asegrese de hacerle al mdico cualquier pregunta que tenga. Document Revised: 12/04/2017 Document Reviewed: 12/04/2017 Elsevier Patient Education  2020  [place] menopausia Menopause La menopausia es el perodo normal de la vida en el que los perodos menstruales cesan por completo. Por lo general, se confirma tras 52mses de no haber tenido un perodo menstrual. La transicin a la menopausia (perimenopausia), con mayor frecuencia, ocurre entre los 49y los 55aos. Durante la perimenopausia, los niveles hormonales cambian en el oGrubbs lo que puede provocar sntomas y aPsychologist, occupational La menopausia podra aumentar el riesgo de sufrir lo siguiente:  Prdida de masa sea (osteoporosis), lo que predispone a la rotura de huesos (fracturas).  Depresin.  Endurecimiento y ePublic librariande las arterias  (aterosclerosis), lo que puede causar infartos de miocardio y accidentes cerebrovasculares. Cules son las causas? A menudo, la causa de esta afeccin es un cambio natural en los niveles hormonales, que ocurre a medida que envejece. La afeccin tambin podra ser provocada por una ciruga en la que se extraigan ambos ovarios (ooforectoma bilateral). Qu incrementa el riesgo? Es ms probable que esta afeccin comience a una temprana edad si tiene ciertas afecciones o se realiza ciertos tratamientos, incluidos los siguientes:  Un tumor en la hipfisis del cerebro.  Una enfermedad que afecte los ovarios y la produccin de hormonas.  Radioterapia para tratar eScience writer  Ciertos tratamientos contra el cncer, como quimioterapia o terapia hormonal (antiestrgeno).  Fumar mucho y consumir alcohol de forma excesiva.  Antecedentes familiares de menopausia temprana. Adems, es ms probable que esta afeccin se presente de manera temprana en las mujeres que son mWinnsboro Cules son los signos o los sntomas? Los sntomas de esta afeccin incluyen los siguientes:  ANurse, learning disability  Perodos menstruales irregulares.  Sudoracin nocturna.  Cambios en el sentimiento respecto de las rOffice Depot Es posible que el deseo sexual disminuya o que se sienta ms cmoda respecto de su sexualidad.  Sequedad vaginal y adelgazamiento de las paredes de la vagina. Esto podra hacer que sienta dolor durante las relaciones sexuales.  Sequedad de la piel y aparicin de aGlass blower/designer  Dolores de cN[place]  Problemas para dormir (insomnio).  Cambios de humor o irritabilidad.  Problemas de memoria.  Aumento de [place]ampton  Crecimiento de bello en la cara y el pecho.  Infecciones en la vejiga o problemas para orinar. Cmo se diagnostica? Esta afeccin se diagnostica en funcin de los antecedentes mdicos, un examen fsico, la edad, los antecedentes menstruales y los sntomas. Tambin le podran realizar  estudios hormonales. Cmo se trata? En algunos los casos, no se necesita tratamiento. Usted y el mdico deben decidir juntos si se debe rArts administrator El tratamiento se determinar en funcin de su cuadro clnico y de sus preferencias. El tratamiento de este cuadro clnico se centra en el control de los sntomas. El tratamiento puede incluir lo siguiente:  Terapia hormonal para la menopausia.  Medicamentos para tratar sntomas o complicaciones especficos.  Acupuntura.  Vitaminas o suplementos herbales. Antes de cBiochemist, clinical es importante que le avise al mdico si tiene antecedentes personales o familiares de lo siguiente:  Enfermedades cardacas.  Cncer de mama.  Cogulos de sGreentown  Diabetes.  Osteoporosis. Siga estas indicaciones en su casa: Estilo de vida  No consuma ningn producto que contenga nicotina o tabaco, como cigarrillos y cPsychologist, sport and exercise Si necesita ayuda para dejar de fumar, consulte al mdico.  Realice, por lo menos, [id number] actividad fsica 5das por semana o ms.  Evite las bebidas con alcohol o cafena, as como las cARAMARK Corporation Esto podra ayudar a prevenir los acaloramientos.  Intente dormir de 7 a 8horas todas las noches.  Si tiene acaloramientos: ? Vstase en capas. ? Evite las cosas que podran Walgreen acaloramientos, como las comidas muy condimentadas, los lugares calientes o el estrs. ? Respire profundamente y despacio cuando comience a Chief Executive Officer. ? Tenga un ventilador en su casa y en su oficina.  Encuentre modos de [employer], por ejemplo, a travs de la respiracin, meditacin o un diario ntimo.  Considere la posibilidad de asistir a terapia grupal con otras mujeres que tengan sntomas de [place]. Pdale recomendaciones al [employer] reuniones de terapia grupal. Comida y bebida  Siga una dieta saludable y equilibrada que incluya cereales integrales,  protenas magras, productos lcteos descremados y [place] frutas y verduras.  El mdico podra recomendarle que agregue una mayor cantidad de soja a su dieta. Algunos de los alimentos que contienen soja son el tofu, el tempeh y [place].  Consuma muchos alimentos que contengan calcio y vitaminaD para Engineer, manufacturing systems salud sea. Algunos productos que contienen mucho calcio son los RadioShack, el yogur, los frijoles, las [place], las sardinas, el brcoli y la col rizada. Medicamentos  [employer] de venta libre y los recetados solamente como se lo haya indicado el mdico.  Hable con el mdico antes de Art gallery manager a tomar cualquier suplemento herbal. [place] vitaminas y los suplementos recetados como se lo haya indicado el mdico. Estos pueden incluir lo siguiente: ? Calcio. Las mujeres de 51aos o ms deben consumir '1200mg'$  (miligramos) de [employer]. ? VitaminaD. Las mujeres necesitan entre 600 y 800unidades internacionales de [place]. ? VitaminasB12 yB6. Procure consumir 78mcrogramos de vitaminaB12 y 1,'5mg'$  de vVF Corporation Instrucciones generales  Lleve un registro de sus perodos menstruales; incluya lo siguiente: ? El momento en que ocurren. ? Qu tan abundantes son y cunto duran. ? Cunto tiempo transcurre entre cada perodo menstrual.  Lleve un registro de los sntomas; anote cundo comienzan, con qu frecuencia ocurren y cunto duran.  Use lubricantes o humectantes vaginales para aliviar la sequedad vaginal y mResearch officer, political partydTira  Concurra a todas las visitas de control como se lo haya indicado el mdico. Esto es importante. Esto incluye la terapia grupal y la psicoterapia. Comunquese con un mdico si:  An tiene perodos menstruales despus de los 55aos.  Siente dolor durante las rOffice Depot  No tuvo perodos menstruales durante los ltimos 167mes y presenta  sangrado vaginal. Solicite ayuda de inmediato si:  Tiene los siguientes sntomas: ? Depresin grave. ? Sangrado vaginal excesivo. ? Dolor al orContinental Airlines? Latidos cardacos rpidos o irregulares (palpitaciones). ? Dolor de cabeza intenso. ? Dolor en el abdomen (abdominal) o dispepsia grave.  Se cay y cree que se ha fracturado un hueso.  Siente dolor en el pecho o en la pierna.  Desarrolla problemas de visin.  Se toca un bulto en la mama. Resumen  La menopausia es el perodo normal de la vida en el que los perodos menstruales cesan por completo. Por lo general, se confirma tras 1244ms de no haber tenido un perodo menstrual.  La transicin a la menopausia (perimenopausia), con mayor frecuencia, ocurre entre los 45 77los 55aos.  Los sntomas pueden controlarse mediante medicamentos, cambios en el estilo de vida y terapias complementarias, como acupuntura.  Siga una dieta equilibrada que incluya muchos nutrientes para favPsychologist, counsellinglud sea y la salud cardaca, y para conChief Technology Officers sntomas durante la menWoodside Eaststa  informacin no tiene Marine scientist el consejo del mdico. Asegrese de hacerle al mdico cualquier pregunta que tenga. Document Revised: 11/14/2016 Document Reviewed: 11/14/2016 Elsevier Patient Education  [employer] [place] [employer]. .

## 2019-07-08 LAB — IGP, APTIMA HPV, RFX 16/18,45
HPV Aptima: NEGATIVE
PAP Smear Comment: 0

## 2019-08-02 NOTE — Addendum Note (Signed)
Addended byBlane Ohara on: 08/02/2019 04:22 PM   Modules accepted: Level of Service

## 2019-11-25 ENCOUNTER — Encounter: Payer: Self-pay | Admitting: Family Medicine

## 2019-11-25 DIAGNOSIS — Z1231 Encounter for screening mammogram for malignant neoplasm of breast: Secondary | ICD-10-CM | POA: Diagnosis not present

## 2020-04-18 ENCOUNTER — Telehealth: Payer: Self-pay

## 2020-04-18 NOTE — Telephone Encounter (Signed)
Husband calling states wife is complaining of lower back pain and burning with urination. States it has been going on for about a week. Denies nausea, loss of appetite. Symptoms have been lasting a week. Scheduled with Carollee Herter, NP 2/16 9:30 am. Husband will be with pt.

## 2020-04-19 ENCOUNTER — Ambulatory Visit: Payer: Federal, State, Local not specified - PPO | Admitting: Nurse Practitioner

## 2020-04-19 ENCOUNTER — Other Ambulatory Visit: Payer: Self-pay

## 2020-04-19 ENCOUNTER — Encounter: Payer: Self-pay | Admitting: Nurse Practitioner

## 2020-04-19 VITALS — BP 112/62 | HR 52 | Temp 96.6°F | Ht 67.0 in | Wt 139.0 lb

## 2020-04-19 DIAGNOSIS — R300 Dysuria: Secondary | ICD-10-CM | POA: Diagnosis not present

## 2020-04-19 DIAGNOSIS — N390 Urinary tract infection, site not specified: Secondary | ICD-10-CM | POA: Diagnosis not present

## 2020-04-19 DIAGNOSIS — M545 Low back pain, unspecified: Secondary | ICD-10-CM

## 2020-04-19 LAB — POCT URINALYSIS DIPSTICK
Bilirubin, UA: NEGATIVE
Blood, UA: NEGATIVE
Glucose, UA: NEGATIVE
Ketones, UA: NEGATIVE
Leukocytes, UA: NEGATIVE
Nitrite, UA: NEGATIVE
Protein, UA: NEGATIVE
Spec Grav, UA: 1.015 (ref 1.010–1.025)
Urobilinogen, UA: NEGATIVE E.U./dL — AB
pH, UA: 6 (ref 5.0–8.0)

## 2020-04-19 LAB — POCT URINE PREGNANCY: Preg Test, Ur: NEGATIVE

## 2020-04-19 MED ORDER — SULFAMETHOXAZOLE-TRIMETHOPRIM 800-160 MG PO TABS: 1.0000 | ORAL_TABLET | Freq: Two times a day (BID) | ORAL | 0 refills | Status: DC

## 2020-04-19 NOTE — Progress Notes (Signed)
Acute Office Visit  Subjective:    Patient ID: Paula Bautista, female    DOB: August 19, 1970, 50 y.o.   MRN: [id number]  Chief Complaint  Patient presents with  . Low back pain    dysuria    HPI Paula Bautista is a 50 year old Hispanic female in today for low back pain, dysuria, frequency, and urgency. She is accompanied by her spouse that interprets [place]. Onset of symptoms was 1-week ago. Treatment  Includes pushing fluids, especially water. She is an avid runner and physically active daily. States she runs approximately 3-miles on paved surface every-other-day. She has a history of plantar fasciitis and is experiencing bilateral knee pain. She has not taken NSAIDs or Tylenol for lower back pain.  States she does not like to take medications other than vitamins.She denies recent heavy lifting, falls, or trauma to low back.  History reviewed. No pertinent past medical history.  Past Surgical History:  Procedure Laterality Date  . APPENDECTOMY      Family History  Problem Relation Age of Onset  . Heart attack Mother   . Heart attack Father     Social History   Socioeconomic History  . Marital status: Married    Spouse name: Not on file  . Number of children: Not on file  . Years of education: Not on file  . Highest education level: Not on file  Occupational History  . Occupation: housewife  Tobacco Use  . Smoking status: Never Smoker  . Smokeless tobacco: Never Used  Substance and Sexual Activity  . Alcohol use: Yes    Alcohol/week: 2.0 standard drinks    Types: 2 Cans of beer per week    Comment: social  . Drug use: Never  . Sexual activity: Yes    Partners: Male  Other Topics Concern  . Not on file  Social History Narrative  . Not on file   Social Determinants of Health   Financial Resource Strain: Not on file  Food Insecurity: Not on file  Transportation Needs: Not on file  Physical Activity: Sufficiently Active  . Days of Exercise per Week:  6 days  . Minutes of Exercise per Session: 40 min  Stress: Not on file  Social Connections: Not on file  Intimate Partner Violence: Not on file    Outpatient Medications Prior to Visit  Medication Sig Dispense Refill  . calcium carbonate (OSCAL) 1500 (600 Ca) MG TABS tablet Take by mouth 2 (two) times daily with a meal.    . Multiple Vitamin (MULTIVITAMIN) capsule Take 1 capsule by mouth daily.    . Multiple Vitamins-Minerals (HAIR SKIN & NAILS ADVANCED) TABS Take by mouth daily.    . Omega-3 Fatty Acids (FISH OIL) 1000 MG CAPS Take by mouth.     No facility-administered medications prior to visit.    No Known Allergies  Review of Systems  Constitutional: Negative for fatigue and fever.  HENT: Negative for congestion, ear pain, sinus pressure and sore throat.   Eyes: Negative for pain.  Respiratory: Negative for cough, chest tightness, shortness of breath and wheezing.   Cardiovascular: Negative for chest pain and palpitations.  Gastrointestinal: Negative for abdominal pain, constipation, diarrhea, nausea and vomiting.  Genitourinary: Positive for dysuria, frequency and urgency. Negative for flank pain and hematuria.       Urinary hesitancy  Musculoskeletal: Positive for arthralgias (bilateral knees) and back pain (low back). Negative for joint swelling and myalgias.  Skin: Negative for rash.  Neurological: Negative for dizziness,  weakness and headaches.  Psychiatric/Behavioral: Negative for dysphoric mood. The patient is not nervous/anxious.        Objective:    Physical Exam Vitals reviewed.  Constitutional:      Appearance: Normal appearance. She is normal weight.  HENT:     Head: Normocephalic.  Cardiovascular:     Rate and Rhythm: Normal rate and regular rhythm.     Pulses: Normal pulses.     Heart sounds: Normal heart sounds.  Pulmonary:     Effort: Pulmonary effort is normal.     Breath sounds: Normal breath sounds.  Abdominal:     General: Bowel sounds are  normal. There is no distension.     Palpations: Abdomen is soft. There is no mass.     Tenderness: There is no abdominal tenderness. There is no right CVA tenderness, left CVA tenderness, guarding or rebound.  Musculoskeletal:        General: Tenderness (low back) present.  Skin:    General: Skin is warm and dry.     Capillary Refill: Capillary refill takes less than 2 seconds.  Neurological:     General: No focal deficit present.     Mental Status: She is alert and oriented to person, place, and time.  Psychiatric:        Mood and Affect: Mood normal.        Behavior: Behavior normal.     BP 112/62 (BP Location: Left Arm, Patient Position: Sitting)   Pulse (!) 52   Temp (!) 96.6 F (35.9 C) (Temporal)   Ht 5\' 7"  (1.702 m)   Wt 139 lb (63 kg)   SpO2 100%   BMI 21.77 kg/m  Wt Readings from Last 3 Encounters:  04/19/20 139 lb (63 kg)  07/05/19 157 lb (71.2 kg)  05/27/19 157 lb (71.2 kg)    Health Maintenance Due  Topic Date Due  . Hepatitis C Screening  Never done  . HIV Screening  Never done  . COLONOSCOPY (Pts 45-64yrs Insurance coverage will need to be confirmed)  Never done  . INFLUENZA VACCINE  Never done       No results found for: TSH Lab Results  Component Value Date   WBC 4.7 05/27/2019   HGB 15.0 05/27/2019   HCT 43.9 05/27/2019   MCV 90 05/27/2019   PLT 259 05/27/2019   Lab Results  Component Value Date   NA 140 05/27/2019   K 4.4 05/27/2019   CO2 24 05/27/2019   GLUCOSE 78 05/27/2019   BUN 14 05/27/2019   CREATININE 0.75 05/27/2019   BILITOT 0.5 05/27/2019   ALKPHOS 86 05/27/2019   AST 28 05/27/2019   ALT 28 05/27/2019   PROT 7.2 05/27/2019   ALBUMIN 4.5 05/27/2019   CALCIUM 9.5 05/27/2019        Assessment & Plan:   1. Acute bilateral low back pain without sciatica - POCT urinalysis dipstick - POCT urine pregnancy -Decrease running until back pain subsides, may do less impact exercise such as elliptical   2. Urinary tract  infection without hematuria, site unspecified - sulfamethoxazole-trimethoprim (BACTRIM DS) 800-160 MG tablet; Take 1 tablet by mouth 2 (two) times daily.  Dispense: 14 tablet; Refill: 0  3. Dysuria - Urine Culture     BACTRIM TWICE DAILY FOR 7 DAYS PUSH FLUIDS PROBIOTIC OR YOGURT TO PREVENT YEAST INFECTION NOTIFY OFFICE IF SYMPTOMS DO NOT IMPROVE OR WORSEN   Follow up: As needed  Signed,  05/29/2019, DNP

## 2020-04-19 NOTE — Patient Instructions (Signed)
BACTRIM TWICE DAILY FOR 7 DAYS PUSH FLUIDS PROBIOTIC OR YOGURT TO PREVENT YEAST INFECTION NOTIFY OFFICE IF SYMPTOMS DO NOT IMPROVE OR WORSEN   Infeccin urinaria en los adultos Urinary Tract Infection, Adult Una infeccin urinaria (IU) puede ocurrir en Corporate treasurer de las vas [place]. Las vas urinarias incluyen lo siguiente:  Los riones.  Los urteres.  La vejiga.  La uretra. Estos rganos fabrican, almacenan y eliminan el pis (orina) del cuerpo. Cules son las causas? La causa de esta infeccin es la presencia de grmenes (bacterias) en la zona genital. Estos grmenes proliferan y causan hinchazn (inflamacin) de las vas urinarias. Qu incrementa el riesgo? Los siguientes factores pueden hacer que sea ms propensa a Clinical cytogeneticist afeccin:  Usar un tubo delgado y pequeo (catter) para drenar el pis.  Imposibilidad de Sales executive momento de orinar o defecar (incontinencia).  Ser mujer. Si usted [employer], estas cosas pueden aumentar el riesgo: ? Usar estos mtodos para evitar un embarazo:  Un medicamento que [employer] espermatozoides (espermicida).  Un dispositivo que impide el paso de los espermatozoides (diafragma). ? Tenerniveles bajos de una hormona femenina (estrgeno). ? Estar embarazada. Es ms probable que sufra esta afeccin si:  Tiene genes que [employer].  Es sexualmente activa.  Toma antibiticos.  Tiene dificultad para orinar debido a: ? Su prstata es ms grande de lo normal, si usted es hombre. ? Obstruccin en la parte del cuerpo que drena el pis de la vejiga. ? Clculo renal. ? Un trastorno nervioso que afecta la vejiga. ? No bebe una cantidad suficiente de lquido. ? No hace pis con la frecuencia suficiente.  Tiene otras afecciones, como: ? Diabetes. ? Un sistema que combate las enfermedades (sistema inmunitario) debilitado. ? Anemia drepanoctica. ? Gota. ? Lesin en la columna vertebral. Cules son los signos o  sntomas? Los sntomas de esta afeccin incluyen:  Necesidad inmediata de hacer pis.  Hacer poca cantidad de pis con mucha frecuencia.  Dolor o ardor al [employer].  Sangre en el pis.  Pis que huele mal o anormal.  Dificultad para hacer pis.  Pis turbio.  Lquido que sale de la vagina, si es [place].  Dolor en la barriga o en la parte baja de la espalda. Otros sntomas pueden incluir los siguientes:  Vmitos.  Falta de apetito.  Sentirse confundido (confuso). Este puede ser Financial risk analyst sntoma en los adultos [place].  Sentirse cansado y malhumorado (irritable).  [place]uts.  Materia fecal lquida (diarrea). Cmo se trata?  Recibir antibiticos.  Recibir otros medicamentos.  Beber una cantidad suficiente de agua. En algunos casos, es posible que deba consultar a Music therapist. Siga estas instrucciones en su casa: Medicamentos  Use los medicamentos de venta libre y los recetados solamente como se lo haya indicado el mdico.  Si le recetaron un antibitico, tmelo como se lo haya indicado el mdico. No deje de tomarlo aunque comience a sentirse mejor. Instrucciones generales  Asegrese de hacer lo siguiente: ? Haga pis hasta que la vejiga quede vaca. ? Nocontenga el pis durante mucho tiempo. ? Vaciar la vejiga despus de Doctor, hospital. ? Lmpiese de adelante hacia atrs despus de hacer pis o defecar, si es mujer. Use cada trozo de papel higinico solo una vez cuando se limpie.  Beba suficiente lquido como para Pharmacologist la orina de color amarillo plido.  Cumpla con todas las visitas de seguimiento.   Comunquese con un mdico si:  No mejora despus de 1 o 2das de tratamiento.  Los sntomas desaparecen y Stage manager. Solicite ayuda de inmediato si:  Tiene un dolor muy intenso en la espalda.  Tiene dolor muy intenso en la parte baja de la barriga.  Tiene fiebre.  Tiene escalofros.  Se siente como si fuera a vomitar o vomita. Resumen  Una  infeccin urinaria (IU) puede ocurrir en Corporate treasurer de las vas [place].  Esta afeccin es causada por la presencia de grmenes en la zona genital.  Existen muchos factores de riesgo de sufrir una IU.  El tratamiento incluye antibiticos.  Beba suficiente lquido como para Pharmacologist la orina de color amarillo plido. Esta informacin no tiene Theme park manager el consejo del mdico. Asegrese de hacerle al mdico cualquier pregunta que tenga. Document Revised: 12/31/2019 Document Reviewed: 12/31/2019 Elsevier Patient Education  2021 Elsevier Inc. Sulfamethoxazole; Trimethoprim, SMX-TMP tablets Qu es este medicamento? SULFAMETOXAZOL; TRIMETOPRIMA o SMX-TMP es una combinacin de un antibitico del grupo de las sulfonamidas y un segundo antibitico, trimetoprima. Se utiliza para tratar o prevenir ciertos tipos de infecciones bacterianas. No es efectivo para resfros, gripe u otras infecciones de origen viral. Este medicamento puede ser utilizado para otros usos; si tiene alguna pregunta consulte con su proveedor de atencin mdica o con su farmacutico. MARCAS COMUNES: Bacter-Aid DS, Bactrim, Bactrim DS, Septra, Septra DS Qu le debo informar a mi profesional de la salud antes de tomar este medicamento? Necesitan saber si usted presenta alguno de los [employer] o situaciones: deficiencia de G6PD VIH o SIDA enfermedad renal enfermedad heptica cantidad baja de plaquetas cantidad baja de glbulos rojos desnutricin problemas estomacales o intestinales, tales como colitis enfermedad tiroidea una reaccin alrgica o inusual al sulfametoxasol, a la trimetoprima, a las sulfonamidas, a otros frmacos, alimentos, colorantes o conservantes si est embarazada o buscando quedar embarazada si est amamantando a un beb Cmo debo utilizar este medicamento? Tome este medicamento por va oral con un vaso de agua. Siga las instrucciones de la etiqueta del [place]. Use su medicamento a  intervalos regulares. No lo use con una frecuencia mayor a la indicada. Complete todas las dosis de su medicamento como se le haya indicado, aun si se siente mejor. No omita ninguna dosis ni suspenda el uso de su medicamento antes de lo indicado. Hable con su pediatra para informarse acerca del uso de este medicamento en nios. Aunque este medicamento se puede recetar a nios tan pequeos como de 2 meses de edad con ciertas afecciones, existen precauciones que deben tomarse. Sobredosis: Pngase en contacto inmediatamente con un centro toxicolgico o una sala de urgencia si usted cree que haya tomado demasiado medicamento. ATENCIN: [employer] es solo para usted. No comparta este medicamento con nadie. Qu sucede si me olvido de una dosis? Si olvida una dosis, adminstrela lo antes posible. Si es casi la hora de la prxima dosis, administre solo esa dosis. No se administre dosis adicionales o dobles. Qu puede interactuar con este medicamento? No use este medicamento con ninguno de los siguientes frmacos: dofetilida Este medicamento tambin puede interactuar con los siguientes frmacos: amantadina pldoras anticonceptivas ciertos medicamentos para la presin sangunea o enfermedad cardiaca ciertos medicamentos para la depresin, tales como amitriptilina ciertos medicamentos para la diabetes, tales como glipizida o gliburida ciertos medicamentos que tratan o previenen cogulos sanguneos, como warfarina ciclosporina digoxina diurticos indometacina metotrexato fenitona procainamida pirimetamina zidovudina Puede ser que esta lista no menciona todas las posibles interacciones. Informe a su profesional de [address] de [employer] productos a base de hierbas, medicamentos de [place] o  suplementos nutritivos que est tomando. Si usted fuma, consume bebidas alcohlicas o si utiliza drogas ilegales, indqueselo tambin a su profesional de Beazer Homesla salud. Algunas sustancias pueden interactuar con su  medicamento. A qu debo estar atento al usar PPL Corporationeste medicamento? Informe a su proveedor de atencin mdica si sus sntomas no comienzan a mejorar o si empeoran. No trate la diarrea con productos de H. J. Heinzventa libre. Contacte a su proveedor de atencin mdica si tiene diarrea por ms de [address], o si es grave y [place]. Este frmaco puede causar reacciones graves en la piel. Pueden suceder semanas a meses despus de comenzar a Actuaryusar el frmaco. Contacte a su proveedor de atencin mdica de inmediato si nota que tiene fiebre o sntomas gripales con una erupcin. La erupcin puede ser roja o Clarisa Flingmorada, y luego puede convertirse en ampollas o descamacin de la piel. O bien, es posible que observe una erupcin roja con hinchazn en la cara, los labios o los ganglios linfticos en el cuello o debajo de los brazos. Este frmaco puede aumentar su sensibilidad al sol. Evite la Halliburton Companyluz solar. Si no la Network engineerpuede evitar, utilice ropa protectora y crema de Orthoptistproteccin solar. No utilice lmparas solares, camas solares ni cabinas solares. Proceda con cuidado al cepillar sus dientes, usar hilo dental o Chemical engineerutilizar palillos para los dientes, ya que podra contraer una infeccin o Geophysicist/field seismologistsangrar con mayor facilidad. Si se somete a algn tratamiento dental, informe a su dentista que est usando este frmaco. Qu efectos secundarios puedo tener al Boston Scientificutilizar este medicamento? Efectos secundarios que debe informar a su mdico o a Producer, television/film/videosu profesional de la salud tan pronto como sea posible: Therapist, artreacciones alrgicas (erupcin cutnea, comezn/picazn o urticaria; hinchazn de la cara, los labios o la lengua) diarrea con sangre o acuosa tos cambios en el ritmo cardiaco (problemas para respirar; Journalist, newspaperdolor en el pecho; mareos; frecuencia cardiaca irregular, rpida; sensacin de desmayo o aturdimiento, cadas) fiebre niveles elevados de potasio (dolor en el pecho; frecuencia cardiaca rpida, irregular; debilidad muscular) lesin renal (dificultad para orinar o cambios en la  cantidad de orina) lesin en el hgado (orina amarilla oscura o [place]marrn; sensacin general de estar enfermo o sntomas gripales; prdida del apetito; dolor en la regin abdominal superior derecha; debilidad o cansancio inusuales; color amarillento de los ojos o la piel) presin sangunea baja (mareos; sensacin de desmayo o aturdimiento; cadas; debilidad o cansancio inusuales) niveles bajos de azcar en la sangre (ansiedad; confusin; mareos; aumento del apetito; debilidad o cansancio inusuales; aumento de la sudoracin; temblores; piel fra y sudorosa; irritabilidad; Engineer, miningdolor de cabeza; visin borrosa; frecuencia cardiaca rpida; prdida del conocimiento) recuentos bajos de glbulos rojos (dificultad para respirar; sensacin de desmayo; aturdimiento; cadas; debilidad o cansancio inusuales) erupcin, fiebre y ganglios linfticos inflamados enrojecimiento, formacin de Chartered loss adjusterampollas, descamacin o distensin de la piel, incluso dentro de la boca problemas para respirar sangrado o moretones inusuales Efectos secundarios que generalmente no requieren atencin mdica (infrmelos a su mdico o a su profesional de la salud si persisten o si son molestos): falta o prdida del apetito nuseas vmito Puede ser que esta lista no menciona todos los posibles efectos secundarios. Comunquese a su mdico por asesoramiento mdico Hewlett-Packardsobre los efectos secundarios. Usted puede informar los efectos secundarios a la FDA por telfono al 1-800-FDA-1088. Dnde debo guardar mi medicina? Mantenga fuera del alcance de los nios. Guarde a una temperatura de entre 15 y 25 grados Celsius (59 a 77 grados Fahrenheit). Proteja de Statisticianla luz. Mantenga el recipiente bien cerrado. Deseche todo el frmaco que  no haya utilizado despus de la fecha de vencimiento. ATENCIN: Este folleto es un resumen. Puede ser que no cubra toda la posible informacin. Si usted tiene preguntas acerca de esta medicina, consulte con su mdico, su farmacutico o su profesional  de Radiographer, therapeutic.  2021 Elsevier/Gold Standard (2019-08-24 00:00:00) Urinary Tract Infection, Adult A urinary tract infection (UTI) is an infection of any part of the urinary tract. The urinary tract includes:  The kidneys.  The ureters.  The bladder.  The urethra. These organs make, store, and get rid of pee (urine) in the body. What are the causes? This infection is caused by germs (bacteria) in your genital area. These germs grow and cause swelling (inflammation) of your urinary tract. What increases the risk? The following factors may make you more likely to develop this condition:  Using a small, thin tube (catheter) to drain pee.  Not being able to control when you pee or poop (incontinence).  Being female. If you are female, these things can increase the risk: ? Using these methods to prevent pregnancy:  A medicine that kills sperm (spermicide).  A device that blocks sperm (diaphragm). ? Having low levels of a female hormone (estrogen). ? Being pregnant. You are more likely to develop this condition if:  You have genes that add to your risk.  You are sexually active.  You take antibiotic medicines.  You have trouble peeing because of: ? A prostate that is bigger than normal, if you are female. ? A blockage in the part of your body that drains pee from the bladder. ? A kidney stone. ? A nerve condition that affects your bladder. ? Not getting enough to drink. ? Not peeing often enough.  You have other conditions, such as: ? Diabetes. ? A weak disease-fighting system (immune system). ? Sickle cell disease. ? Gout. ? Injury of the spine. What are the signs or symptoms? Symptoms of this condition include:  Needing to pee right away.  Peeing small amounts often.  Pain or burning when peeing.  Blood in the pee.  Pee that smells bad or not like normal.  Trouble peeing.  Pee that is cloudy.  Fluid coming from the vagina, if you are female.  Pain in the  belly or lower back. Other symptoms include:  Vomiting.  Not feeling hungry.  Feeling mixed up (confused). This may be the first symptom in older adults.  Being tired and grouchy (irritable).  A fever.  Watery poop (diarrhea). How is this treated?  Taking antibiotic medicine.  Taking other medicines.  Drinking enough water. In some cases, you may need to see a specialist. Follow these instructions at home: Medicines  Take over-the-counter and prescription medicines only as told by your doctor.  If you were prescribed an antibiotic medicine, take it as told by your doctor. Do not stop taking it even if you start to feel better. General instructions  Make sure you: ? Pee until your bladder is empty. ? Do not hold pee for a long time. ? Empty your bladder after sex. ? Wipe from front to back after peeing or pooping if you are a female. Use each tissue one time when you wipe.  Drink enough fluid to keep your pee pale yellow.  Keep all follow-up visits.   Contact a doctor if:  You do not get better after 1-2 days.  Your symptoms go away and then come back. Get help right away if:  You have very bad back pain.  You have very bad pain in your lower belly.  You have a fever.  You have chills.  You feeling like you will vomit or you vomit. Summary  A urinary tract infection (UTI) is an infection of any part of the urinary tract.  This condition is caused by germs in your genital area.  There are many risk factors for a UTI.  Treatment includes antibiotic medicines.  Drink enough fluid to keep your pee pale yellow. This information is not intended to replace advice given to you by your health care provider. Make sure you discuss any questions you have with your health care provider. Document Revised: 10/01/2019 Document Reviewed: 10/01/2019 Elsevier Patient Education  2021 Elsevier Inc. Sulfamethoxazole; Trimethoprim, SMX-TMP tablets What is this  medicine? SULFAMETHOXAZOLE; TRIMETHOPRIM or SMX-TMP (suhl fuh meth OK suh zohl; trye METH oh prim) is a combination of a sulfonamide antibiotic and a second antibiotic, trimethoprim. It is used to treat or prevent certain kinds of bacterial infections. It will not work for colds, flu, or other viral infections. This medicine may be used for other purposes; ask your health care provider or pharmacist if you have questions. COMMON BRAND NAME(S): Bacter-Aid DS, Bactrim, Bactrim DS, Septra, Septra DS What should I tell my health care provider before I take this medicine? They need to know if you have any of these conditions:  G6PD deficiency  HIV or AIDS  kidney disease  liver disease  low platelets  low red blood cell counts  poor nutrition  stomach or intestine problems like colitis  thyroid disease  an unusual or allergic reaction to sulfamethoxazole, trimethoprim, sulfa drugs, other drugs, foods, dyes, or preservatives  pregnant or trying to get pregnant  breast-feeding How should I use this medicine? Take this medicine by mouth with a glass of water. Follow the directions on the prescription label. Take your medicine at regular intervals. Do not take it more often than directed. Take all of your medicine as directed even if you think you are better. Do not skip doses or stop your medicine early. Talk to your pediatrician regarding the use of this medicine in children. While this drug may be prescribed for children as young as 2 months for selected conditions, precautions do apply. Overdosage: If you think you have taken too much of this medicine contact a poison control center or emergency room at once. NOTE: This medicine is only for you. Do not share this medicine with others. What if I miss a dose? If you miss a dose, take it as soon as you can. If it is almost time for your next dose, take only that dose. Do not take double or extra doses. What may interact with this  medicine? Do not take this medicine with any of the following medications:  dofetilide This medicine may also interact with the following medications:  amantadine  birth control pills  certain medicines for blood pressure, heart disease  certain medicines for depression, like amitriptyline  certain medicines for diabetes, like glipizide or glyburide  certain medicines that treat or prevent blood clots like warfarin  cyclosporine  digoxin  diuretics  indomethacin  methotrexate  phenytoin  procainamide  pyrimethamine  zidovudine This list may not describe all possible interactions. Give your health care provider a list of all the medicines, herbs, non-prescription drugs, or dietary supplements you use. Also tell them if you smoke, drink alcohol, or use illegal drugs. Some items may interact with your medicine. What should I watch for while  using this medicine? Tell your health care provider if your symptoms do not start to get better or if they get worse. Do not treat diarrhea with over the counter products. Contact your health care provider if you have diarrhea that lasts more than 2 days or if it is severe and watery. This drug may cause serious skin reactions. They can happen weeks to months after starting the drug. Contact your health care provider right away if you notice fevers or flu-like symptoms with a rash. The rash may be red or purple and then turn into blisters or peeling of the skin. Or, you might notice a red rash with swelling of the face, lips or lymph nodes in your neck or under your arms. This drug can make you more sensitive to the sun. Keep out of the sun. If you cannot avoid being in the sun, wear protective clothing and sunscreen. Do not use sun lamps or tanning beds/booths. Be careful brushing or flossing your teeth or using a toothpick because you may get an infection or bleed more easily. If you have any dental work done, tell your dentist you are  receiving this drug. What side effects may I notice from receiving this medicine? Side effects that you should report to your doctor or health care professional as soon as possible:  allergic reactions (skin rash, itching or hives; swelling of the face, lips, or tongue)  bloody or watery diarrhea  cough  heartbeat rhythm changes (trouble breathing; chest pain; dizziness; fast, irregular heartbeat; feeling faint or lightheaded, falls,)  fever  high potassium levels (chest pain; fast, irregular heartbeat; muscle weakness)  kidney injury (trouble passing urine or change in the amount of urine)  liver injury (dark yellow or brown urine; general ill feeling or flu-like symptoms; loss of appetite, right upper belly pain; unusually weak or tired, yellowing of the eyes or skin)  low blood pressure (dizziness; feeling faint or lightheaded, falls; unusually weak or tired)  low blood sugar (feeling anxious; confusion; dizziness; increased hunger; unusually weak or tired; increased sweating; shakiness; cold, clammy skin; irritable; headache; blurred vision; fast heartbeat; loss of consciousness)  low red blood cell counts (trouble breathing; feeling faint; lightheaded, falls; unusually weak or tired)  rash, fever, and swollen lymph nodes  redness, blistering, peeling, or loosening of the skin, including inside the mouth  trouble breathing  unusual bruising or bleeding Side effects that usually do not require medical attention (report to your doctor or health care professional if they continue or are bothersome):  lack or loss of appetite  nausea  vomiting This list may not describe all possible side effects. Call your doctor for medical advice about side effects. You may report side effects to FDA at 1-800-FDA-1088. Where should I keep my medicine? Keep out of the reach of children. Store between 15 and 25 degrees C (59 to 77 degrees F). Protect from light. Keep the container tightly  closed. Throw away any unused drug after the expiration date. NOTE: This sheet is a summary. It may not cover all possible information. If you have questions about this medicine, talk to your doctor, pharmacist, or health care provider.  2021 Elsevier/Gold Standard (2019-01-22 09:54:22)

## 2020-04-21 LAB — URINE CULTURE: Organism ID, Bacteria: NO GROWTH

## 2020-04-24 ENCOUNTER — Other Ambulatory Visit: Payer: Self-pay | Admitting: Nurse Practitioner

## 2020-04-24 DIAGNOSIS — N951 Menopausal and female climacteric states: Secondary | ICD-10-CM

## 2020-04-24 MED ORDER — ESTRADIOL 0.1 MG/GM VA CREA: TOPICAL_CREAM | VAGINAL | 12 refills | Status: DC

## 2020-12-25 ENCOUNTER — Ambulatory Visit (INDEPENDENT_AMBULATORY_CARE_PROVIDER_SITE_OTHER): Payer: Federal, State, Local not specified - PPO | Admitting: Family Medicine

## 2020-12-25 ENCOUNTER — Encounter: Payer: Self-pay | Admitting: Family Medicine

## 2020-12-25 ENCOUNTER — Other Ambulatory Visit: Payer: Self-pay

## 2020-12-25 VITALS — BP 106/60 | HR 60 | Temp 97.2°F | Resp 14 | Ht 67.0 in | Wt 133.2 lb

## 2020-12-25 DIAGNOSIS — Z0000 Encounter for general adult medical examination without abnormal findings: Secondary | ICD-10-CM | POA: Diagnosis not present

## 2020-12-25 DIAGNOSIS — Z1231 Encounter for screening mammogram for malignant neoplasm of breast: Secondary | ICD-10-CM

## 2020-12-25 DIAGNOSIS — M9902 Segmental and somatic dysfunction of thoracic region: Secondary | ICD-10-CM | POA: Diagnosis not present

## 2020-12-25 DIAGNOSIS — M9901 Segmental and somatic dysfunction of cervical region: Secondary | ICD-10-CM | POA: Diagnosis not present

## 2020-12-25 DIAGNOSIS — Z1211 Encounter for screening for malignant neoplasm of colon: Secondary | ICD-10-CM

## 2020-12-25 DIAGNOSIS — M7912 Myalgia of auxiliary muscles, head and neck: Secondary | ICD-10-CM | POA: Diagnosis not present

## 2020-12-25 DIAGNOSIS — M5383 Other specified dorsopathies, cervicothoracic region: Secondary | ICD-10-CM | POA: Diagnosis not present

## 2020-12-25 NOTE — Progress Notes (Signed)
Subjective:  Patient ID: Paula Bautista, female    DOB: October 19, 1970  Age: 50 y.o. MRN: [id number]  Chief Complaint  Patient presents with   Annual Exam     HPI Well Adult Physical: Patient here for a comprehensive physical exam.The patient reports no problems. Do you take any herbs or supplements that were not prescribed by a doctor? yes Are you taking calcium supplements? yes Are you taking aspirin daily? no  Encounter for general adult medical examination without abnormal findings  Physical ("At Risk" items are starred): Patient's last physical exam was 1 year ago .  Patient is not afflicted from Stress Incontinence and Urge Incontinence  Patient wears a seat belt, has smoke detectors, has carbon monoxide detectors, practices appropriate gun safety, and wears sunscreen with extended sun exposure. Dental Care: biannual cleanings, brushes and flosses daily. Ophthalmology/Optometry: Annual visit.  Hearing loss: none Vision impairments: GLASSES  Menarche: 15  LMP: 49 YO. Pregnancy history: G0P0 Safe at home: yes Last Mammogram was 11/2019  Flowsheet Row Office Visit from 12/25/2020 in [facility]  PHQ-2 Total Score 0         Social Hx   Social History   Socioeconomic History   Marital status: Married    Spouse name: Not on file   Number of children: Not on file   Years of education: Not on file   Highest education level: Not on file  Occupational History   Occupation: housewife  Tobacco Use   Smoking status: Never   Smokeless tobacco: Never  Substance and Sexual Activity   Alcohol use: Not Currently   Drug use: Never   Sexual activity: Yes    Partners: Male  Other Topics Concern   Not on file  Social History Narrative   Not on file   Social Determinants of Health   Financial Resource Strain: Low Risk    Difficulty of Paying Living Expenses: Not hard at all  Food Insecurity: No Food Insecurity   Worried About Programme researcher, broadcasting/film/video in  the Last Year: Never true   Ran Out of Food in the Last Year: Never true  Transportation Needs: No Transportation Needs   Lack of Transportation (Medical): No   Lack of Transportation (Non-Medical): No  Physical Activity: Sufficiently Active   Days of Exercise per Week: 5 days   Minutes of Exercise per Session: 60 min  Stress: No Stress Concern Present   Feeling of Stress : Not at all  Social Connections: Unknown   Frequency of Communication with Friends and Family: Not on file   Frequency of Social Gatherings with Friends and Family: Not on file   Attends Religious Services: Not on file   Active Member of Clubs or Organizations: Not on file   Attends Banker Meetings: Not on file   Marital Status: Married   History reviewed. No pertinent past medical history. Past Surgical History:  Procedure Laterality Date   APPENDECTOMY      Family History  Problem Relation Age of Onset   Heart attack Mother    Heart attack Father     Review of Systems  Constitutional:  Negative for chills, fatigue and fever.  HENT:  Negative for congestion, rhinorrhea and sore throat.   Respiratory:  Negative for cough and shortness of breath.   Cardiovascular:  Negative for chest pain.  Gastrointestinal:  Negative for abdominal pain, constipation, diarrhea, nausea and vomiting.  Genitourinary:  Negative for dysuria and urgency.  Musculoskeletal:  Negative for back pain and myalgias.  Neurological:  Negative for dizziness, weakness, light-headedness and headaches.  Psychiatric/Behavioral:  Negative for dysphoric mood. The patient is not nervous/anxious.     Objective:  BP 106/60   Pulse 60   Temp (!) 97.2 F (36.2 C)   Resp 14   Ht 5\' 7"  (1.702 m)   Wt 133 lb 3.2 oz (60.4 kg)   BMI 20.86 kg/m   BP/Weight 12/25/2020 04/19/2020 07/05/2019  Systolic BP 106 112 110  Diastolic BP 60 62 78  Wt. (Lbs) 133.2 139 157  BMI 20.86 21.77 25.34    Physical Exam Vitals reviewed.   Constitutional:      General: She is not in acute distress.    Appearance: Normal appearance. She is normal weight.  HENT:     Right Ear: Tympanic membrane and ear canal normal.     Left Ear: Tympanic membrane and ear canal normal.     Nose: Nose normal. No congestion or rhinorrhea.  Eyes:     Conjunctiva/sclera: Conjunctivae normal.  Neck:     Thyroid: No thyroid mass.  Cardiovascular:     Rate and Rhythm: Normal rate and regular rhythm.     Heart sounds: Normal heart sounds. No murmur heard. Pulmonary:     Effort: Pulmonary effort is normal.     Breath sounds: Normal breath sounds.  Abdominal:     General: Bowel sounds are normal.     Palpations: Abdomen is soft. There is no mass.     Tenderness: There is no abdominal tenderness.  Musculoskeletal:        General: Normal range of motion.  Lymphadenopathy:     Cervical: No cervical adenopathy.  Skin:    General: Skin is warm and dry.  Neurological:     Mental Status: She is alert and oriented to person, place, and time.     Cranial Nerves: No cranial nerve deficit.  Psychiatric:        Mood and Affect: Mood normal.        Behavior: Behavior normal.    Lab Results  Component Value Date   WBC 4.5 12/25/2020   HGB 14.7 12/25/2020   HCT 43.1 12/25/2020   PLT 233 12/25/2020   GLUCOSE 85 12/25/2020   CHOL 273 (H) 12/25/2020   TRIG 58 12/25/2020   HDL 113 12/25/2020   LDLCALC 151 (H) 12/25/2020   ALT 22 12/25/2020   AST 26 12/25/2020   NA 140 12/25/2020   K 5.0 12/25/2020   CL 104 12/25/2020   CREATININE 0.71 12/25/2020   BUN 15 12/25/2020   CO2 23 12/25/2020   TSH 2.410 12/25/2020    Assessment & Plan:   Problem List Items Addressed This Visit       Other   Encounter for screening mammogram for malignant neoplasm of breast    Mammogram ordered.      Routine medical exam - Primary    Keep up good work!! Labs drawn today.      Relevant Orders   CBC with Differential/Platelet (Completed)    Comprehensive metabolic panel (Completed)   Lipid panel (Completed)   TSH (Completed)   Screen for colon cancer    Referral to Gastroenterologist for colonoscopy was ordered.      Relevant Orders   Ambulatory referral to Gastroenterology   Other Visit Diagnoses     Visit for screening mammogram       Relevant Orders   MM DIGITAL SCREENING BILATERAL  Body mass index is 20.86 kg/m.     This is a list of the screening recommended for you and due dates:  Health Maintenance  Topic Date Due   HIV Screening  Never done   Hepatitis C Screening: USPSTF Recommendation to screen - Ages 88-79 yo.  Never done   Colon Cancer Screening  Never done   Flu Shot  06/01/2021*   Pap Smear  07/05/2022   Tetanus Vaccine  03/05/2027   Pneumococcal Vaccination  Aged Out   HPV Vaccine  Aged Out  *Topic was postponed. The date shown is not the original due date.    Clayborn Bigness I Leal-Borjas,acting as a scribe for Blane Ohara, MD.,have documented all relevant documentation on the behalf of Blane Ohara, MD,as directed by  Blane Ohara, MD while in the presence of Blane Ohara, MD.    Follow-up: Return in about 1 year (around 12/25/2021) for cpe fasting. And 3 months fasting.   An After Visit Summary was printed and given to the patient.  Blane Ohara, MD [facility] [phone]

## 2020-12-25 NOTE — Patient Instructions (Signed)
Keep up good work!  

## 2020-12-26 LAB — CBC WITH DIFFERENTIAL/PLATELET
Basophils Absolute: 0 10*3/uL (ref 0.0–0.2)
Basos: 1 %
EOS (ABSOLUTE): 0.1 10*3/uL (ref 0.0–0.4)
Eos: 3 %
Hematocrit: 43.1 % (ref 34.0–46.6)
Hemoglobin: 14.7 g/dL (ref 11.1–15.9)
Immature Grans (Abs): 0 10*3/uL (ref 0.0–0.1)
Immature Granulocytes: 0 %
Lymphocytes Absolute: 1.9 10*3/uL (ref 0.7–3.1)
Lymphs: 42 %
MCH: 31.4 pg (ref 26.6–33.0)
MCHC: 34.1 g/dL (ref 31.5–35.7)
MCV: 92 fL (ref 79–97)
Monocytes Absolute: 0.3 10*3/uL (ref 0.1–0.9)
Monocytes: 6 %
Neutrophils Absolute: 2.2 10*3/uL (ref 1.4–7.0)
Neutrophils: 48 %
Platelets: 233 10*3/uL (ref 150–450)
RBC: 4.68 x10E6/uL (ref 3.77–5.28)
RDW: 11.7 % (ref 11.7–15.4)
WBC: 4.5 10*3/uL (ref 3.4–10.8)

## 2020-12-26 LAB — COMPREHENSIVE METABOLIC PANEL
ALT: 22 IU/L (ref 0–32)
AST: 26 IU/L (ref 0–40)
Albumin/Globulin Ratio: 2 (ref 1.2–2.2)
Albumin: 4.6 g/dL (ref 3.8–4.8)
Alkaline Phosphatase: 65 IU/L (ref 44–121)
BUN/Creatinine Ratio: 21 (ref 9–23)
BUN: 15 mg/dL (ref 6–24)
Bilirubin Total: 0.4 mg/dL (ref 0.0–1.2)
CO2: 23 mmol/L (ref 20–29)
Calcium: 9.4 mg/dL (ref 8.7–10.2)
Chloride: 104 mmol/L (ref 96–106)
Creatinine, Ser: 0.71 mg/dL (ref 0.57–1.00)
Globulin, Total: 2.3 g/dL (ref 1.5–4.5)
Glucose: 85 mg/dL (ref 70–99)
Potassium: 5 mmol/L (ref 3.5–5.2)
Sodium: 140 mmol/L (ref 134–144)
Total Protein: 6.9 g/dL (ref 6.0–8.5)
eGFR: 104 mL/min/{1.73_m2} (ref >59)

## 2020-12-26 LAB — LIPID PANEL
Chol/HDL Ratio: 2.4 ratio (ref 0.0–4.4)
Cholesterol, Total: 273 mg/dL — ABNORMAL HIGH (ref 100–199)
HDL: 113 mg/dL (ref >39)
LDL Chol Calc (NIH): 151 mg/dL — ABNORMAL HIGH (ref 0–99)
Triglycerides: 58 mg/dL (ref 0–149)
VLDL Cholesterol Cal: 9 mg/dL (ref 5–40)

## 2020-12-26 LAB — TSH: TSH: 2.41 u[IU]/mL (ref 0.450–4.500)

## 2020-12-26 NOTE — Progress Notes (Signed)
Blood count normal.  Liver function normal.  Kidney function normal.  Thyroid function normal.  Cholesterol: LDL 151. Much too high. Goal is less than 100.  Low fat diet and exercise. Recommend start on crestor 10 mg once daily at night.  Follow up in 3 months fasting.

## 2020-12-31 DIAGNOSIS — Z0000 Encounter for general adult medical examination without abnormal findings: Secondary | ICD-10-CM | POA: Insufficient documentation

## 2020-12-31 DIAGNOSIS — Z1211 Encounter for screening for malignant neoplasm of colon: Secondary | ICD-10-CM | POA: Insufficient documentation

## 2020-12-31 NOTE — Assessment & Plan Note (Addendum)
Keep up good work!! Labs drawn today.

## 2020-12-31 NOTE — Assessment & Plan Note (Signed)
Mammogram ordered

## 2020-12-31 NOTE — Assessment & Plan Note (Signed)
Referral to Gastroenterologist for colonoscopy was ordered.

## 2021-01-12 ENCOUNTER — Other Ambulatory Visit: Payer: Self-pay | Admitting: Family Medicine

## 2021-01-12 DIAGNOSIS — Z1231 Encounter for screening mammogram for malignant neoplasm of breast: Secondary | ICD-10-CM

## 2021-01-29 ENCOUNTER — Other Ambulatory Visit: Payer: Self-pay

## 2021-01-29 ENCOUNTER — Ambulatory Visit
Admission: RE | Admit: 2021-01-29 | Discharge: 2021-01-29 | Disposition: A | Discharge status: Home / Self Care | Payer: Federal, State, Local not specified - PPO | Source: Ambulatory Visit | Attending: Family Medicine | Admitting: Family Medicine

## 2021-01-29 DIAGNOSIS — Z1231 Encounter for screening mammogram for malignant neoplasm of breast: Secondary | ICD-10-CM | POA: Diagnosis not present

## 2021-02-05 DIAGNOSIS — K219 Gastro-esophageal reflux disease without esophagitis: Secondary | ICD-10-CM | POA: Diagnosis not present

## 2021-02-27 DIAGNOSIS — Z1211 Encounter for screening for malignant neoplasm of colon: Secondary | ICD-10-CM | POA: Diagnosis not present

## 2021-02-27 LAB — HM COLONOSCOPY

## 2021-03-12 ENCOUNTER — Encounter: Payer: Self-pay | Admitting: Family Medicine

## 2021-07-11 DIAGNOSIS — N644 Mastodynia: Secondary | ICD-10-CM | POA: Diagnosis not present

## 2021-07-11 DIAGNOSIS — E7800 Pure hypercholesterolemia, unspecified: Secondary | ICD-10-CM | POA: Diagnosis not present

## 2021-07-11 DIAGNOSIS — Z0000 Encounter for general adult medical examination without abnormal findings: Secondary | ICD-10-CM | POA: Diagnosis not present

## 2021-07-25 DIAGNOSIS — N644 Mastodynia: Secondary | ICD-10-CM | POA: Diagnosis not present

## 2021-09-03 DIAGNOSIS — R140 Abdominal distension (gaseous): Secondary | ICD-10-CM | POA: Diagnosis not present

## 2021-09-03 DIAGNOSIS — Z6820 Body mass index (BMI) 20.0-20.9, adult: Secondary | ICD-10-CM | POA: Diagnosis not present

## 2021-12-25 ENCOUNTER — Ambulatory Visit: Payer: Federal, State, Local not specified - PPO | Admitting: Family Medicine

## 2023-05-13 IMAGING — MG MM DIGITAL SCREENING BILAT W/ TOMO AND CAD
6 of 10 series · 6 of 30 positions shown · non-contrast
Comparison: Previous exam(s).

CLINICAL DATA: Screening.

EXAM:
DIGITAL SCREENING BILATERAL MAMMOGRAM WITH TOMOSYNTHESIS AND CAD
TECHNIQUE: Bilateral screening digital craniocaudal and mediolateral oblique
mammograms were obtained. Bilateral screening digital breast
tomosynthesis was performed. The images were evaluated with
computer-aided detection.

[L MLO synth-2D]
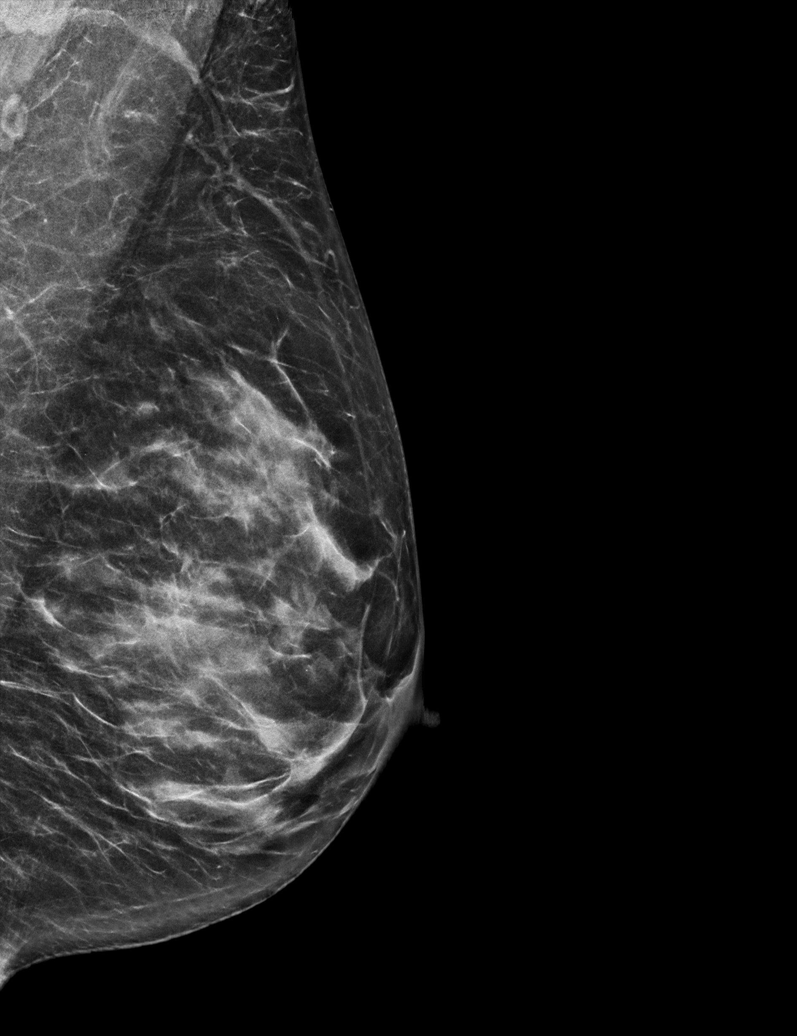

[R CC synth-2D (1 of 2)]
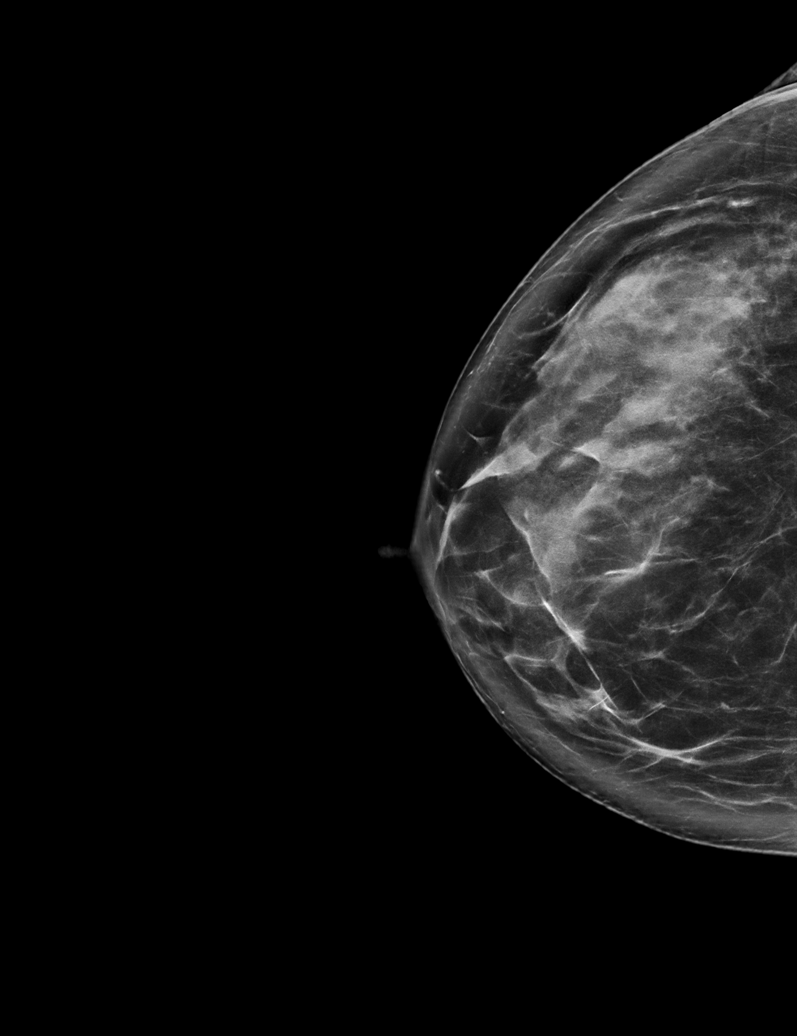

[L CC synth-2D]
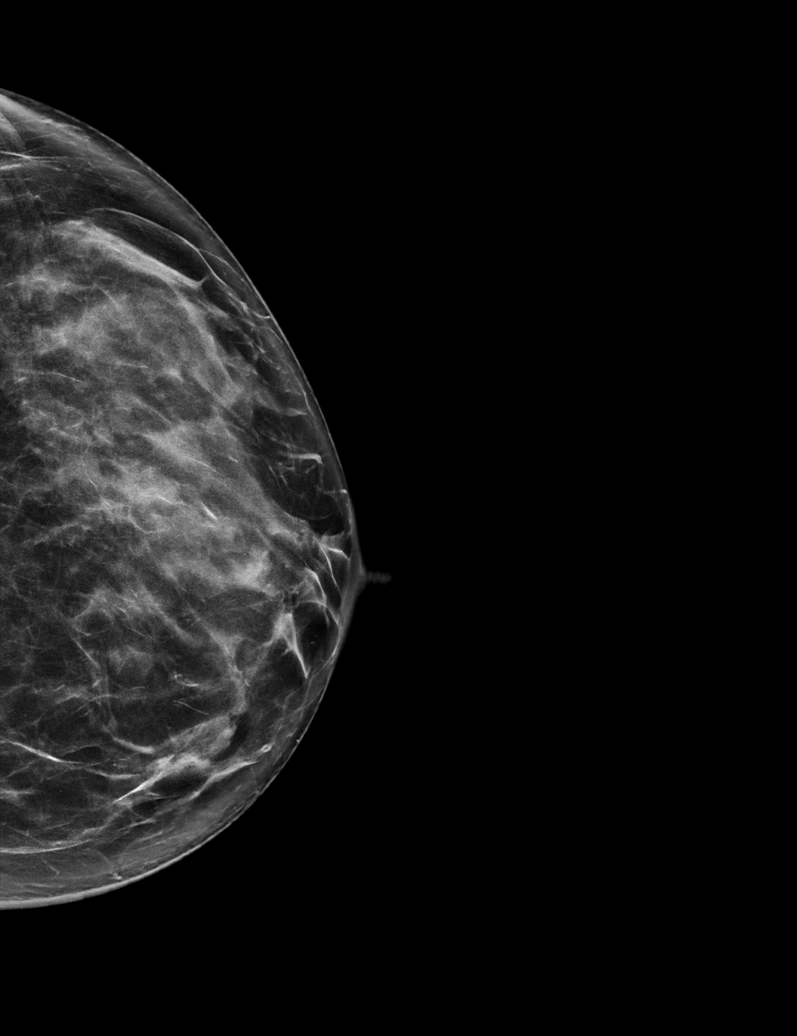

[R MLO synth-2D]
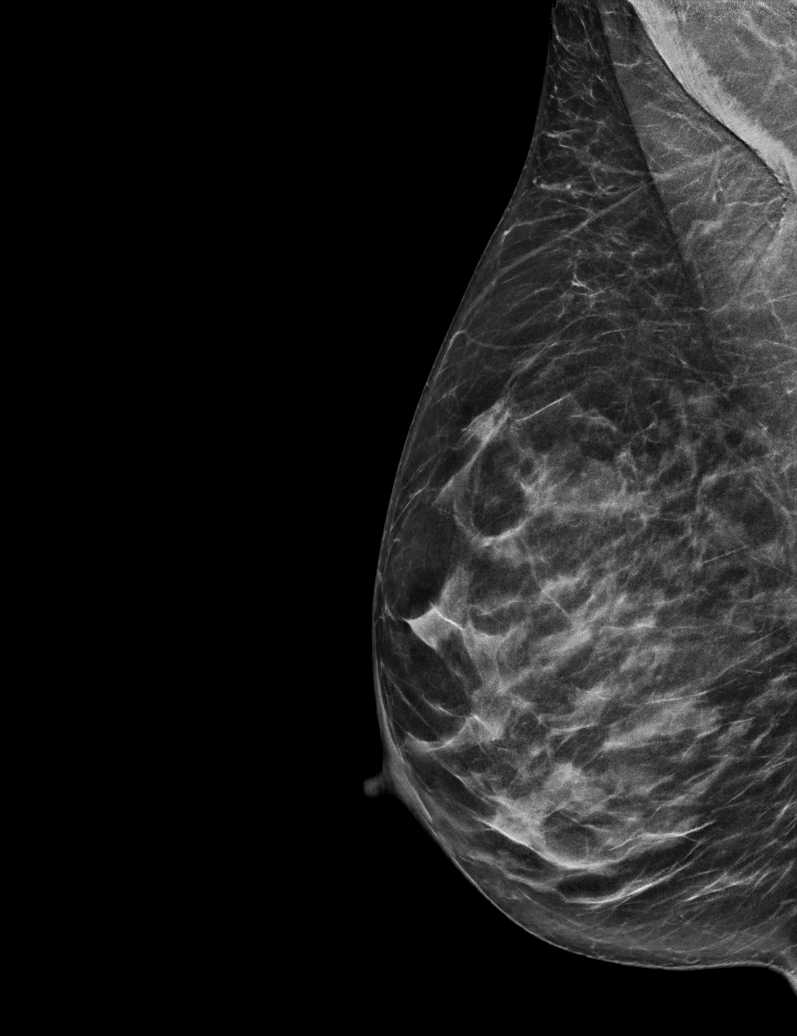

[R CC synth-2D (2 of 2)]
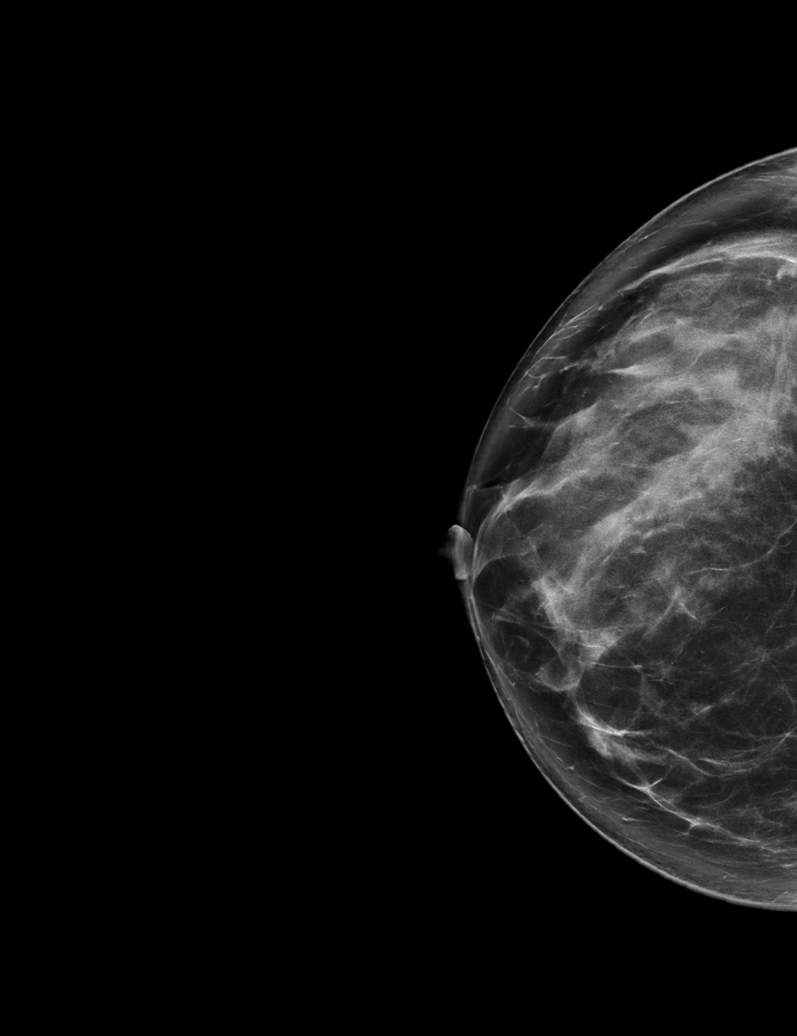

[R MLO tomo · tomo slice 29/58.0]
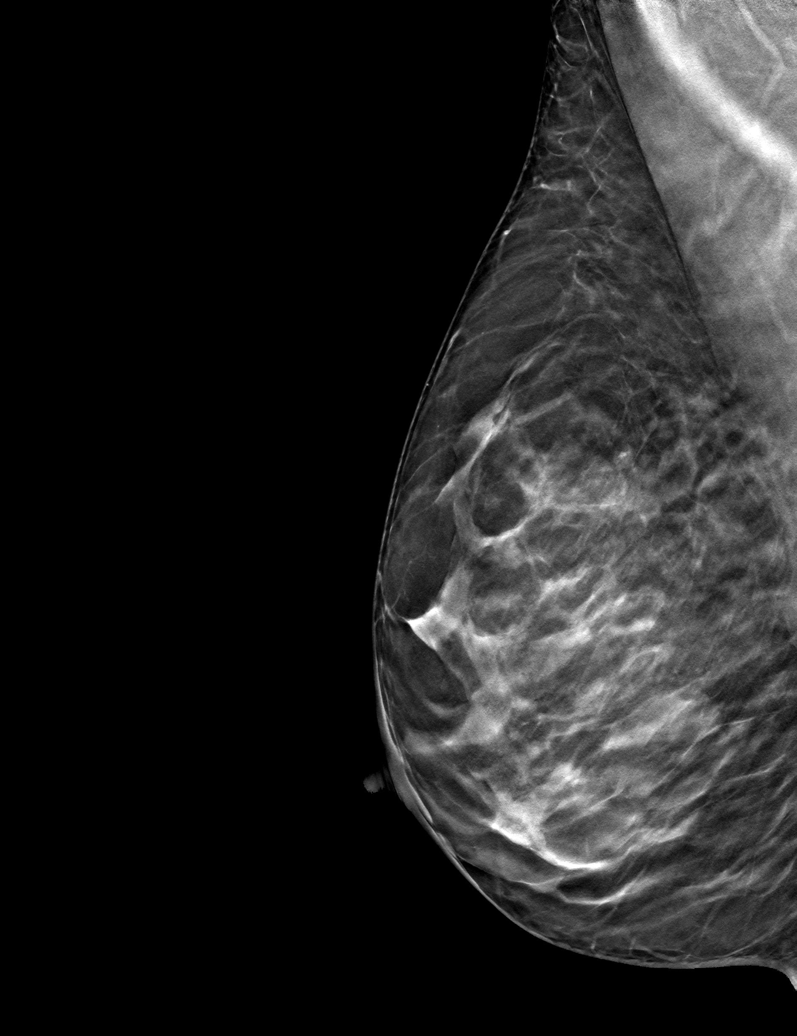

[6 of 30 positions shown; findings below may reference images not displayed]

ACR Breast Density Category d: The breast tissue is extremely dense,
which lowers the sensitivity of mammography
FINDINGS: There are no findings suspicious for malignancy.
IMPRESSION: No mammographic evidence of malignancy. A result letter of this
screening mammogram will be mailed directly to the patient.

RECOMMENDATION:
Screening mammogram in one year. (Code:TA-[id number])

BI-RADS CATEGORY  1: Negative.
